# Patient Record
Sex: Female | Born: 1950 | Race: White | Hispanic: No | Smoking: Never smoker
Health system: Southern US, Community
[De-identification: ages and names within clinical notes are randomized; demographics above are authoritative.]

## PROBLEM LIST (undated history)

## (undated) DIAGNOSIS — J302 Other seasonal allergic rhinitis: Secondary | ICD-10-CM

## (undated) DIAGNOSIS — I10 Essential (primary) hypertension: Secondary | ICD-10-CM

## (undated) DIAGNOSIS — T7840XA Allergy, unspecified, initial encounter: Secondary | ICD-10-CM

## (undated) DIAGNOSIS — I499 Cardiac arrhythmia, unspecified: Secondary | ICD-10-CM

## (undated) DIAGNOSIS — K219 Gastro-esophageal reflux disease without esophagitis: Secondary | ICD-10-CM

## (undated) DIAGNOSIS — K760 Fatty (change of) liver, not elsewhere classified: Secondary | ICD-10-CM

## (undated) DIAGNOSIS — Z87442 Personal history of urinary calculi: Secondary | ICD-10-CM

## (undated) DIAGNOSIS — C84A0 Cutaneous T-cell lymphoma, unspecified, unspecified site: Principal | ICD-10-CM

## (undated) DIAGNOSIS — H269 Unspecified cataract: Secondary | ICD-10-CM

## (undated) DIAGNOSIS — I7300 Raynaud's syndrome without gangrene: Secondary | ICD-10-CM

## (undated) DIAGNOSIS — M1990 Unspecified osteoarthritis, unspecified site: Secondary | ICD-10-CM

## (undated) HISTORY — DX: Cutaneous T-cell lymphoma, unspecified, unspecified site: C84.A0

## (undated) HISTORY — PX: WISDOM TOOTH EXTRACTION: SHX21

## (undated) HISTORY — DX: Unspecified osteoarthritis, unspecified site: M19.90

## (undated) HISTORY — DX: Allergy, unspecified, initial encounter: T78.40XA

## (undated) HISTORY — DX: Unspecified cataract: H26.9

---

## 2007-04-24 ENCOUNTER — Other Ambulatory Visit: Payer: Self-pay

## 2007-04-24 ENCOUNTER — Emergency Department: Payer: Self-pay | Admitting: Emergency Medicine

## 2007-09-02 ENCOUNTER — Ambulatory Visit: Payer: Self-pay

## 2010-02-13 ENCOUNTER — Ambulatory Visit: Payer: Self-pay

## 2012-12-14 ENCOUNTER — Ambulatory Visit: Payer: Self-pay | Admitting: Oncology

## 2012-12-29 ENCOUNTER — Ambulatory Visit: Payer: Self-pay | Admitting: Oncology

## 2012-12-30 LAB — COMPREHENSIVE METABOLIC PANEL
Alkaline Phosphatase: 106 U/L (ref 50–136)
Anion Gap: 7 (ref 7–16)
BUN: 17 mg/dL (ref 7–18)
Calcium, Total: 8.6 mg/dL (ref 8.5–10.1)
Chloride: 106 mmol/L (ref 98–107)
Co2: 26 mmol/L (ref 21–32)
Potassium: 3.6 mmol/L (ref 3.5–5.1)
SGOT(AST): 22 U/L (ref 15–37)

## 2012-12-30 LAB — CBC CANCER CENTER
Eosinophil #: 0.1 x10 3/mm (ref 0.0–0.7)
Eosinophil %: 1.9 %
HCT: 38.9 % (ref 35.0–47.0)
HGB: 13 g/dL (ref 12.0–16.0)
Lymphocyte #: 2.2 x10 3/mm (ref 1.0–3.6)
Lymphocyte %: 29.5 %
MCH: 29.2 pg (ref 26.0–34.0)
MCHC: 33.5 g/dL (ref 32.0–36.0)
MCV: 87 fL (ref 80–100)
Monocyte #: 0.4 x10 3/mm (ref 0.2–0.9)
Neutrophil %: 61.7 %
RBC: 4.46 10*6/uL (ref 3.80–5.20)

## 2012-12-30 LAB — LACTATE DEHYDROGENASE: LDH: 179 U/L (ref 81–246)

## 2013-01-04 ENCOUNTER — Ambulatory Visit: Payer: Self-pay | Admitting: Oncology

## 2013-01-19 ENCOUNTER — Ambulatory Visit: Payer: Self-pay | Admitting: Oncology

## 2013-02-23 ENCOUNTER — Ambulatory Visit: Payer: Self-pay | Admitting: Oncology

## 2013-02-24 LAB — COMPREHENSIVE METABOLIC PANEL WITH GFR
Albumin: 3.3 g/dL — ABNORMAL LOW
Alkaline Phosphatase: 108 U/L
Anion Gap: 8
BUN: 14 mg/dL
Bilirubin,Total: 0.3 mg/dL
Calcium, Total: 8.8 mg/dL
Chloride: 103 mmol/L
Co2: 30 mmol/L
Creatinine: 0.68 mg/dL
EGFR (African American): >60
EGFR (Non-African Amer.): >60
Glucose: 121 mg/dL — ABNORMAL HIGH
Osmolality: 283
Potassium: 3.6 mmol/L
SGOT(AST): 29 U/L
SGPT (ALT): 45 U/L
Sodium: 141 mmol/L
Total Protein: 6.7 g/dL

## 2013-02-24 LAB — CBC CANCER CENTER
Basophil #: 0 "x10 3/mm "
Basophil %: 1 %
Eosinophil #: 0.1 "x10 3/mm "
Eosinophil %: 2.5 %
HCT: 37.7 %
HGB: 13.1 g/dL
Lymphocyte %: 50.6 %
Lymphs Abs: 2 "x10 3/mm "
MCH: 29.9 pg
MCHC: 34.8 g/dL
MCV: 86 fL
Monocyte #: 0.3 "x10 3/mm "
Monocyte %: 7.4 %
Neutrophil #: 1.5 "x10 3/mm "
Neutrophil %: 38.5 %
Platelet: 208 "x10 3/mm "
RBC: 4.38 "x10 6/mm "
RDW: 13.1 %
WBC: 3.9 "x10 3/mm "

## 2013-03-21 ENCOUNTER — Ambulatory Visit: Payer: Self-pay | Admitting: Oncology

## 2013-08-11 ENCOUNTER — Ambulatory Visit: Payer: Self-pay

## 2013-08-25 ENCOUNTER — Ambulatory Visit: Payer: Self-pay | Admitting: Oncology

## 2013-08-25 LAB — COMPREHENSIVE METABOLIC PANEL
Anion Gap: 2 — ABNORMAL LOW (ref 7–16)
BUN: 21 mg/dL — ABNORMAL HIGH (ref 7–18)
Bilirubin,Total: 0.5 mg/dL (ref 0.2–1.0)
Calcium, Total: 9.5 mg/dL (ref 8.5–10.1)
Chloride: 106 mmol/L (ref 98–107)
Co2: 30 mmol/L (ref 21–32)
EGFR (African American): >60
Glucose: 80 mg/dL (ref 65–99)
Potassium: 3.7 mmol/L (ref 3.5–5.1)
SGOT(AST): 32 U/L (ref 15–37)
SGPT (ALT): 39 U/L (ref 12–78)
Total Protein: 7.1 g/dL (ref 6.4–8.2)

## 2013-08-25 LAB — CBC CANCER CENTER
Basophil #: 0.1 x10 3/mm (ref 0.0–0.1)
Basophil %: 0.7 %
Eosinophil #: 0.2 x10 3/mm (ref 0.0–0.7)
Eosinophil %: 1.9 %
HGB: 13.7 g/dL (ref 12.0–16.0)
Lymphocyte #: 2.3 x10 3/mm (ref 1.0–3.6)
MCHC: 34 g/dL (ref 32.0–36.0)
Monocyte #: 0.6 x10 3/mm (ref 0.2–0.9)
Monocyte %: 6.8 %
Neutrophil #: 5.5 x10 3/mm (ref 1.4–6.5)
Neutrophil %: 64 %
Platelet: 279 x10 3/mm (ref 150–440)
RDW: 13.1 % (ref 11.5–14.5)
WBC: 8.6 x10 3/mm (ref 3.6–11.0)

## 2013-09-20 ENCOUNTER — Ambulatory Visit: Payer: Self-pay | Admitting: Oncology

## 2014-02-22 ENCOUNTER — Ambulatory Visit: Payer: Self-pay | Admitting: Oncology

## 2014-02-22 LAB — COMPREHENSIVE METABOLIC PANEL
ALK PHOS: 95 U/L
ALT: 26 U/L (ref 12–78)
AST: 22 U/L (ref 15–37)
Albumin: 3.7 g/dL (ref 3.4–5.0)
Anion Gap: 7 (ref 7–16)
BUN: 27 mg/dL — AB (ref 7–18)
Bilirubin,Total: 0.4 mg/dL (ref 0.2–1.0)
CALCIUM: 8.5 mg/dL (ref 8.5–10.1)
CO2: 29 mmol/L (ref 21–32)
Chloride: 105 mmol/L (ref 98–107)
Creatinine: 0.66 mg/dL (ref 0.60–1.30)
Glucose: 98 mg/dL (ref 65–99)
Osmolality: 286 (ref 275–301)
Potassium: 3.9 mmol/L (ref 3.5–5.1)
Sodium: 141 mmol/L (ref 136–145)
TOTAL PROTEIN: 6.4 g/dL (ref 6.4–8.2)

## 2014-02-22 LAB — CBC CANCER CENTER
BASOS ABS: 0.1 x10 3/mm (ref 0.0–0.1)
BASOS PCT: 1.2 %
EOS ABS: 0.4 x10 3/mm (ref 0.0–0.7)
EOS PCT: 4.3 %
HCT: 37.5 % (ref 35.0–47.0)
HGB: 12.6 g/dL (ref 12.0–16.0)
Lymphocyte #: 2.3 x10 3/mm (ref 1.0–3.6)
Lymphocyte %: 27.7 %
MCH: 28.9 pg (ref 26.0–34.0)
MCHC: 33.7 g/dL (ref 32.0–36.0)
MCV: 86 fL (ref 80–100)
MONOS PCT: 7.7 %
Monocyte #: 0.6 x10 3/mm (ref 0.2–0.9)
NEUTROS ABS: 4.8 x10 3/mm (ref 1.4–6.5)
Neutrophil %: 59.1 %
PLATELETS: 267 x10 3/mm (ref 150–440)
RBC: 4.36 10*6/uL (ref 3.80–5.20)
RDW: 13.2 % (ref 11.5–14.5)
WBC: 8.2 x10 3/mm (ref 3.6–11.0)

## 2014-02-22 LAB — LACTATE DEHYDROGENASE: LDH: 167 U/L (ref 81–246)

## 2014-02-22 LAB — SEDIMENTATION RATE: Erythrocyte Sed Rate: 9 mm/hr (ref 0–30)

## 2014-03-21 ENCOUNTER — Ambulatory Visit: Payer: Self-pay | Admitting: Oncology

## 2014-08-29 ENCOUNTER — Ambulatory Visit: Payer: Self-pay | Admitting: Oncology

## 2014-08-29 LAB — CBC CANCER CENTER
BASOS PCT: 1.1 %
Basophil #: 0.1 x10 3/mm (ref 0.0–0.1)
EOS ABS: 0.3 x10 3/mm (ref 0.0–0.7)
EOS PCT: 3.4 %
HCT: 38.9 % (ref 35.0–47.0)
HGB: 13 g/dL (ref 12.0–16.0)
Lymphocyte #: 2.4 x10 3/mm (ref 1.0–3.6)
Lymphocyte %: 30.8 %
MCH: 29.6 pg (ref 26.0–34.0)
MCHC: 33.4 g/dL (ref 32.0–36.0)
MCV: 89 fL (ref 80–100)
MONO ABS: 0.5 x10 3/mm (ref 0.2–0.9)
Monocyte %: 6.3 %
Neutrophil #: 4.5 x10 3/mm (ref 1.4–6.5)
Neutrophil %: 58.4 %
PLATELETS: 289 x10 3/mm (ref 150–440)
RBC: 4.39 10*6/uL (ref 3.80–5.20)
RDW: 13.6 % (ref 11.5–14.5)
WBC: 7.6 x10 3/mm (ref 3.6–11.0)

## 2014-08-29 LAB — COMPREHENSIVE METABOLIC PANEL
Albumin: 3.8 g/dL (ref 3.4–5.0)
Alkaline Phosphatase: 107 U/L
Anion Gap: 6 — ABNORMAL LOW (ref 7–16)
BUN: 18 mg/dL (ref 7–18)
Bilirubin,Total: 0.4 mg/dL (ref 0.2–1.0)
CALCIUM: 9.5 mg/dL (ref 8.5–10.1)
Chloride: 106 mmol/L (ref 98–107)
Co2: 31 mmol/L (ref 21–32)
Creatinine: 0.66 mg/dL (ref 0.60–1.30)
EGFR (African American): >60
EGFR (Non-African Amer.): >60
Glucose: 92 mg/dL (ref 65–99)
OSMOLALITY: 287 (ref 275–301)
Potassium: 3.5 mmol/L (ref 3.5–5.1)
SGOT(AST): 12 U/L — ABNORMAL LOW (ref 15–37)
SGPT (ALT): 24 U/L
SODIUM: 143 mmol/L (ref 136–145)
Total Protein: 6.7 g/dL (ref 6.4–8.2)

## 2014-08-29 LAB — CREATININE, SERUM: Creatine, Serum: 0.66

## 2014-09-20 ENCOUNTER — Ambulatory Visit: Payer: Self-pay | Admitting: Oncology

## 2015-03-24 ENCOUNTER — Other Ambulatory Visit: Payer: Self-pay | Admitting: *Deleted

## 2015-03-24 DIAGNOSIS — C84A0 Cutaneous T-cell lymphoma, unspecified, unspecified site: Secondary | ICD-10-CM

## 2015-03-27 ENCOUNTER — Other Ambulatory Visit: Payer: Self-pay

## 2015-03-27 ENCOUNTER — Inpatient Hospital Stay: Payer: BLUE CROSS/BLUE SHIELD | Attending: Oncology

## 2015-03-27 ENCOUNTER — Encounter: Payer: Self-pay | Admitting: Oncology

## 2015-03-27 ENCOUNTER — Encounter (INDEPENDENT_AMBULATORY_CARE_PROVIDER_SITE_OTHER): Payer: Self-pay

## 2015-03-27 ENCOUNTER — Ambulatory Visit: Payer: Self-pay | Admitting: Oncology

## 2015-03-27 ENCOUNTER — Inpatient Hospital Stay (HOSPITAL_BASED_OUTPATIENT_CLINIC_OR_DEPARTMENT_OTHER): Payer: BLUE CROSS/BLUE SHIELD | Admitting: Oncology

## 2015-03-27 VITALS — BP 147/95 | HR 86 | Temp 96.7°F | Wt 148.1 lb

## 2015-03-27 DIAGNOSIS — Z79899 Other long term (current) drug therapy: Secondary | ICD-10-CM | POA: Diagnosis not present

## 2015-03-27 DIAGNOSIS — C84A0 Cutaneous T-cell lymphoma, unspecified, unspecified site: Secondary | ICD-10-CM | POA: Insufficient documentation

## 2015-03-27 DIAGNOSIS — R21 Rash and other nonspecific skin eruption: Secondary | ICD-10-CM | POA: Insufficient documentation

## 2015-03-27 LAB — CBC WITH DIFFERENTIAL/PLATELET
Basophils Absolute: 0.1 10*3/uL (ref 0–0.1)
Basophils Relative: 1 %
Eosinophils Absolute: 0.1 10*3/uL (ref 0–0.7)
Eosinophils Relative: 2 %
HCT: 39.9 % (ref 35.0–47.0)
Hemoglobin: 13.5 g/dL (ref 12.0–16.0)
Lymphocytes Relative: 41 %
Lymphs Abs: 2.5 10*3/uL (ref 1.0–3.6)
MCH: 29.5 pg (ref 26.0–34.0)
MCHC: 33.9 g/dL (ref 32.0–36.0)
MCV: 86.9 fL (ref 80.0–100.0)
Monocytes Absolute: 0.5 10*3/uL (ref 0.2–0.9)
Monocytes Relative: 8 %
Neutro Abs: 2.9 10*3/uL (ref 1.4–6.5)
Neutrophils Relative %: 48 %
Platelets: 241 10*3/uL (ref 150–440)
RBC: 4.59 MIL/uL (ref 3.80–5.20)
RDW: 13.5 % (ref 11.5–14.5)
WBC: 6.1 10*3/uL (ref 3.6–11.0)

## 2015-03-27 LAB — COMPREHENSIVE METABOLIC PANEL WITH GFR
ALT: 47 U/L (ref 14–54)
AST: 40 U/L (ref 15–41)
Albumin: 4 g/dL (ref 3.5–5.0)
Alkaline Phosphatase: 107 U/L (ref 38–126)
Anion gap: 6 (ref 5–15)
BUN: 15 mg/dL (ref 6–20)
CO2: 27 mmol/L (ref 22–32)
Calcium: 9.3 mg/dL (ref 8.9–10.3)
Chloride: 107 mmol/L (ref 101–111)
Creatinine, Ser: 0.57 mg/dL (ref 0.44–1.00)
GFR calc Af Amer: >60 mL/min
GFR calc non Af Amer: >60 mL/min
Glucose, Bld: 88 mg/dL (ref 65–99)
Potassium: 3.7 mmol/L (ref 3.5–5.1)
Sodium: 140 mmol/L (ref 135–145)
Total Bilirubin: 0.6 mg/dL (ref 0.3–1.2)
Total Protein: 6.8 g/dL (ref 6.5–8.1)

## 2015-03-27 LAB — LACTATE DEHYDROGENASE: LDH: 164 U/L (ref 98–192)

## 2015-03-27 NOTE — Progress Notes (Signed)
Pt former smoker.  Does not have living will.  Pt c/o stomach pain that started in November.  Pt states if she sits up straight she does not have problems.  She can hear gurgling when she moves into straight system.  She has tried colon cleanse and probiotics. Thinks she has partial blockage.

## 2015-03-28 ENCOUNTER — Encounter: Payer: Self-pay | Admitting: Oncology

## 2015-03-28 DIAGNOSIS — C84A0 Cutaneous T-cell lymphoma, unspecified, unspecified site: Secondary | ICD-10-CM

## 2015-03-28 DIAGNOSIS — C8442 Peripheral T-cell lymphoma, not classified, intrathoracic lymph nodes: Secondary | ICD-10-CM | POA: Insufficient documentation

## 2015-03-28 HISTORY — DX: Cutaneous T-cell lymphoma, unspecified, unspecified site: C84.A0

## 2015-03-28 NOTE — Progress Notes (Signed)
Jeanne Lewis Telephone:(336) [phone]  Fax:(336) [place]: 12-27-1950  MR#: [id number]  UTM#:[id number]  Patient Care Team: No Pcp Per Patient as PCP - General (General Practice)  CHIEF COMPLAINT:  Chief Complaint  Patient presents with  . 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  Patient had significant side effect to that treatment.  Then  was put on triamcinolone 0.1% ointment from October 22 30 February 2 007 with a good response. patient did not have any significant followup since then.  Patient continued to have a rash and some .nodule but refused biopsy. 2.biopsy in March of 2001inguinal positive for CD34 from the left side abdomen revealed atypical mononuclear cells infiltrate. Cells were immuno positive for CD3 findings are not completely diagnostic may be consistent with CTCL     Cutaneous T-cell lymphoma   03/28/2015 Initial Diagnosis Cutaneous T-cell lymphoma    No flowsheet data found.  INTERVAL HISTORY: she was referred to me for further evaluation.  Complains owelling of both lower extremity.  Fatigue.  No chills.  No fever.  No cough or shortness of breath patient had a PET scan done a complete evaluaton  and restaging for cutaneous T-cell lymphoma   patient  is here for ongoing evaluation and treatment consideration. skin nodule has decreased in size.  Patient has not noticed any new areas remains afebrile.   patient has noticed   two small nodule on the left upper extremity Getting regular mammogram done under  [facility].  No chills.  No fever.Patient is here for further followup regarding atypical lymphocyte infiltrate the skin. March 27, 2015 Patient came today has noticed a few tiny nodule on both upper extremity.  Complains of some nonspecified epigastric discomfort.  Taking probiotic.  No  diarrhea.  No chills.  No fever.Marland Kitchen  REVIEW OF SYSTEMS:   Gen. status: No chills or fever. Skin: Has noticed few nodules in upper extremity GI: Epigastric discomfort.  No diarrhea.  No nausea or vomiting.  Taking probiotic.  Does not want to try Prilosec. Cardiac: No chest pain or paroxysmal nocturnal dyspnea Lungs: No cough or shortness of breath Lower extremity no edema Neurological system no headache or dizziness As per HPI. Otherwise, a complete review of systems is negatve.  PAST MEDICAL HISTORY: Past Medical History  Diagnosis Date  . Bone cancer   . Cutaneous T-cell lymphoma 03/28/2015    PAST SURGICAL HISTORY: PFSH:  Comments Family history of breast cancer and skin cancer.  Family history of heart disease and hypertension   Social History negative alcohol, negative tobacco   Additional Past Medical and Surgical History No significant of possible medical and surgical history    FAMILY HISTORY There is no significant family history of breast cancer, ovarian cancer, colon cancer  ADVANCED DIRECTIVES:  No advanced care directive information would be given HEALTH MAINTENANCE: History  Substance Use Topics  . Smoking status: Never Smoker   . Smokeless tobacco: Not on file  . Alcohol Use: Not on file      Allergies  Allergen Reactions  . Morphine And Related Other (See Comments)    Blurred vision  . Neosporin [Neomycin-Bacitracin Zn-Polymyx] Itching  . Polysporin [Bacitracin-Polymyxin B] Itching    Current Outpatient Prescriptions  Medication Sig Dispense Refill  . acetaminophen (TYLENOL) 325 MG tablet Take  650 mg by mouth every 4 (four) hours as needed.    . Ascorbic Acid (VITAMIN C) 1000 MG tablet Take 1,000 mg by mouth daily.    Marland Kitchen aspirin 81 MG tablet Take 81 mg by mouth daily.    . Cholecalciferol (VITAMIN D-3) 1000 UNITS CAPS Take 1 capsule by mouth daily at 6 (six) AM.    . loratadine (CLARITIN) 10 MG tablet Take 10 mg by mouth daily.    . naproxen  (NAPROSYN) 500 MG tablet Take 500 mg by mouth 2 (two) times daily with a meal.     No current facility-administered medications for this visit.    OBJECTIVE:  Filed Vitals:   03/27/15 1410  BP: 147/95  Pulse: 86  Temp: 96.7 F (35.9 C)     Body mass index is 29.09 kg/(m^2).    ECOG FS:0 - Asymptomatic  PHYSICAL EXAM: Goal status: Performance status is good.  Patient has not lost significant weight HEENT: No evidence of stomatitis. Sclera and conjunctivae :: No jaundice.   pale looking. Lungs: Air  entry equal on both sides.  No rhonchi.  No rales.  Cardiac: Heart sounds are normal.  No pericardial rub.  No murmur. Lymphatic system: Cervical, axillary, inguinal, lymph nodes not palpable GI: Abdomen is soft.  No ascites.  Liver spleen not palpable.  No tenderness.  Bowel sounds are within normal limit Lower extremity: No edema Neurological system: Higher functions, cranial nerves intact no evidence of peripheral neuropathy. Skin: Few nodules in left upper extremity and right upper extremity very small few millimeter No other skin abnormality or lymphatic abnormality noticed   LAB RESULTS:  Appointment on 03/27/2015  Component Date Value Ref Range Status  . WBC 03/27/2015 6.1  3.6 - 11.0 K/uL Final  . RBC 03/27/2015 4.59  3.80 - 5.20 MIL/uL Final  . Hemoglobin 03/27/2015 13.5  12.0 - 16.0 g/dL Final  . HCT 03/27/2015 39.9  35.0 - 47.0 % Final  . MCV 03/27/2015 86.9  80.0 - 100.0 fL Final  . MCH 03/27/2015 29.5  26.0 - 34.0 pg Final  . MCHC 03/27/2015 33.9  32.0 - 36.0 g/dL Final  . RDW 03/27/2015 13.5  11.5 - 14.5 % Final  . Platelets 03/27/2015 241  150 - 440 K/uL Final  . Neutrophils Relative % 03/27/2015 48   Final  . Neutro Abs 03/27/2015 2.9  1.4 - 6.5 K/uL Final  . Lymphocytes Relative 03/27/2015 41   Final  . Lymphs Abs 03/27/2015 2.5  1.0 - 3.6 K/uL Final  . Monocytes Relative 03/27/2015 8   Final  . Monocytes Absolute 03/27/2015 0.5  0.2 - 0.9 K/uL Final  .  Eosinophils Relative 03/27/2015 2   Final  . Eosinophils Absolute 03/27/2015 0.1  0 - 0.7 K/uL Final  . Basophils Relative 03/27/2015 1   Final  . Basophils Absolute 03/27/2015 0.1  0 - 0.1 K/uL Final  . Sodium 03/27/2015 140  135 - 145 mmol/L Final  . Potassium 03/27/2015 3.7  3.5 - 5.1 mmol/L Final  . Chloride 03/27/2015 107  101 - 111 mmol/L Final  . CO2 03/27/2015 27  22 - 32 mmol/L Final  . Glucose, Bld 03/27/2015 88  65 - 99 mg/dL Final  . BUN 03/27/2015 15  6 - 20 mg/dL Final  . Creatinine, Ser 03/27/2015 0.57  0.44 - 1.00 mg/dL Final  . Calcium 03/27/2015 9.3  8.9 - 10.3 mg/dL Final  . Total Protein 03/27/2015 6.8  6.5 - 8.1 g/dL Final  .  Albumin 03/27/2015 4.0  3.5 - 5.0 g/dL Final  . AST 03/27/2015 40  15 - 41 U/L Final  . ALT 03/27/2015 47  14 - 54 U/L Final  . Alkaline Phosphatase 03/27/2015 107  38 - 126 U/L Final  . Total Bilirubin 03/27/2015 0.6  0.3 - 1.2 mg/dL Final  . GFR calc non Af Amer 03/27/2015 >60  >60 mL/min Final  . GFR calc Af Amer 03/27/2015 >60  >60 mL/min Final   Comment: (NOTE) The eGFR has been calculated using the CKD EPI equation. This calculation has not been validated in all clinical situations. eGFR's persistently <60 mL/min signify possible Chronic Kidney Disease.   . Anion gap 03/27/2015 6  5 - 15 Final  . LDH 03/27/2015 164  98 - 192 U/L Final      STUDIES: No results found.  ASSESSMENT: [age]-year-old lady with suspected cutaneous T-cell lymphoma presently not on any therapy. Patient never had definitive diagnosis of cutaneous T-cell lymphoma   MEDICAL DECISION MAKING:  All lab data has been reviewed. Nonspecific skin nodule needs to be biopsied patient was referred to dermatologist and will be followed after biopsies done Abdominal pain is nonspecific patient was initially advised to take Prilosec but patient refused the pain persist and if skin nodule shows that diagnosis of cutaneous T-cell lymphoma PET scan or CT scan would be  advised Reevaluate patient's skin biopsy  Patient expressed understanding and was in agreement with this plan. She also understands that She can call clinic at any time with any questions, concerns, or complaints.    No matching staging information was found for the patient.  Forest Gleason, MD   03/28/2015 8:18 AM

## 2015-04-11 ENCOUNTER — Telehealth: Payer: Self-pay | Admitting: *Deleted

## 2015-04-11 DIAGNOSIS — C84A0 Cutaneous T-cell lymphoma, unspecified, unspecified site: Secondary | ICD-10-CM

## 2015-04-11 NOTE — Telephone Encounter (Signed)
OK to cancel 6/30 appt, reschedule her to see Dr Oliva Bustard in 1 year with CBC METC, LDH per Dr Oliva Bustard. Message sent to scheduling and patient notified of this

## 2015-04-18 ENCOUNTER — Ambulatory Visit: Payer: Self-pay | Admitting: Oncology

## 2015-04-18 ENCOUNTER — Other Ambulatory Visit: Payer: Self-pay

## 2015-04-20 ENCOUNTER — Ambulatory Visit: Payer: BLUE CROSS/BLUE SHIELD | Admitting: Oncology

## 2015-10-25 ENCOUNTER — Other Ambulatory Visit: Payer: Self-pay | Admitting: Oncology

## 2015-10-25 ENCOUNTER — Other Ambulatory Visit: Payer: Self-pay | Admitting: Family Medicine

## 2015-10-25 DIAGNOSIS — Z1231 Encounter for screening mammogram for malignant neoplasm of breast: Secondary | ICD-10-CM

## 2015-11-02 ENCOUNTER — Ambulatory Visit: Payer: BLUE CROSS/BLUE SHIELD | Attending: Family Medicine

## 2015-11-30 ENCOUNTER — Ambulatory Visit
Admission: RE | Admit: 2015-11-30 | Discharge: 2015-11-30 | Disposition: A | Discharge status: Home / Self Care | Payer: BLUE CROSS/BLUE SHIELD | Source: Ambulatory Visit | Attending: Family Medicine | Admitting: Family Medicine

## 2015-11-30 DIAGNOSIS — Z1231 Encounter for screening mammogram for malignant neoplasm of breast: Secondary | ICD-10-CM

## 2015-12-04 ENCOUNTER — Other Ambulatory Visit: Payer: Self-pay | Admitting: Family Medicine

## 2015-12-04 DIAGNOSIS — Z1231 Encounter for screening mammogram for malignant neoplasm of breast: Secondary | ICD-10-CM

## 2016-04-10 ENCOUNTER — Other Ambulatory Visit: Payer: BLUE CROSS/BLUE SHIELD

## 2016-04-10 ENCOUNTER — Ambulatory Visit: Payer: BLUE CROSS/BLUE SHIELD | Admitting: Oncology

## 2016-04-10 ENCOUNTER — Inpatient Hospital Stay: Payer: BLUE CROSS/BLUE SHIELD | Attending: Oncology | Admitting: Oncology

## 2016-04-10 ENCOUNTER — Inpatient Hospital Stay: Payer: BLUE CROSS/BLUE SHIELD

## 2016-04-10 VITALS — BP 124/80 | HR 105 | Temp 98.0°F

## 2016-04-10 DIAGNOSIS — Z79899 Other long term (current) drug therapy: Secondary | ICD-10-CM | POA: Diagnosis not present

## 2016-04-10 DIAGNOSIS — R2232 Localized swelling, mass and lump, left upper limb: Secondary | ICD-10-CM | POA: Insufficient documentation

## 2016-04-10 DIAGNOSIS — C84A0 Cutaneous T-cell lymphoma, unspecified, unspecified site: Secondary | ICD-10-CM

## 2016-04-10 DIAGNOSIS — Z8572 Personal history of non-Hodgkin lymphomas: Secondary | ICD-10-CM | POA: Insufficient documentation

## 2016-04-10 DIAGNOSIS — Z780 Asymptomatic menopausal state: Secondary | ICD-10-CM | POA: Diagnosis not present

## 2016-04-10 LAB — CBC WITH DIFFERENTIAL/PLATELET
Basophils Absolute: 0.1 10*3/uL (ref 0–0.1)
Basophils Relative: 1 %
EOS ABS: 0.2 10*3/uL (ref 0–0.7)
Eosinophils Relative: 3 %
HCT: 39.1 % (ref 35.0–47.0)
HEMOGLOBIN: 13.4 g/dL (ref 12.0–16.0)
LYMPHS ABS: 2.3 10*3/uL (ref 1.0–3.6)
LYMPHS PCT: 35 %
MCH: 30.4 pg (ref 26.0–34.0)
MCHC: 34.3 g/dL (ref 32.0–36.0)
MCV: 88.5 fL (ref 80.0–100.0)
MONOS PCT: 6 %
Monocytes Absolute: 0.4 10*3/uL (ref 0.2–0.9)
NEUTROS PCT: 55 %
Neutro Abs: 3.7 10*3/uL (ref 1.4–6.5)
Platelets: 239 10*3/uL (ref 150–440)
RBC: 4.42 MIL/uL (ref 3.80–5.20)
RDW: 13.1 % (ref 11.5–14.5)
WBC: 6.7 10*3/uL (ref 3.6–11.0)

## 2016-04-10 LAB — COMPREHENSIVE METABOLIC PANEL
ALK PHOS: 86 U/L (ref 38–126)
ALT: 23 U/L (ref 14–54)
ANION GAP: 8 (ref 5–15)
AST: 25 U/L (ref 15–41)
Albumin: 4.2 g/dL (ref 3.5–5.0)
BILIRUBIN TOTAL: 0.4 mg/dL (ref 0.3–1.2)
BUN: 23 mg/dL — ABNORMAL HIGH (ref 6–20)
CALCIUM: 9.1 mg/dL (ref 8.9–10.3)
CO2: 26 mmol/L (ref 22–32)
CREATININE: 0.67 mg/dL (ref 0.44–1.00)
Chloride: 105 mmol/L (ref 101–111)
Glucose, Bld: 121 mg/dL — ABNORMAL HIGH (ref 65–99)
Potassium: 3.6 mmol/L (ref 3.5–5.1)
SODIUM: 139 mmol/L (ref 135–145)
Total Protein: 6.8 g/dL (ref 6.5–8.1)

## 2016-04-10 LAB — LACTATE DEHYDROGENASE: LDH: 148 U/L (ref 98–192)

## 2016-04-10 NOTE — Progress Notes (Signed)
[place]  Telephone:(336) [phone]  Fax:(336) [place] DOB: December 18, 1950  MR#: [id number]  HKV#:[id number]  Patient Care Team: No Pcp Per Patient as PCP - General (General Practice)  CHIEF COMPLAINT:  Chief Complaint  Patient presents with  . Follow-up    INTERVAL HISTORY: Patient returns to clinic for one year follow up. She was diagnosed with T-cell lymphoma in 2002 and finished treatment at [facility] in 2003.  She currently feels well. She does have a nodule in the left upper extremity. She has no other complaints today.    REVIEW OF SYSTEMS:   Review of Systems  Constitutional: Negative.   HENT: Negative.   Eyes: Negative.   Respiratory: Negative.   Cardiovascular: Negative.   Gastrointestinal: Negative.   Genitourinary: Negative.   Musculoskeletal: Negative.   Skin: Positive for rash. Negative for itching.  Neurological: Negative.   Endo/Heme/Allergies: Negative.   Psychiatric/Behavioral: Negative.     As per HPI. Otherwise, a complete review of systems is negatve.  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  Patient had significant side effect to that treatment.  Then  was put on triamcinolone 0.1% ointment from October 22 30 February 2 007 with a good response. patient did not have any significant followup since then.  Patient continued to have a rash and some .nodule but refused biopsy. 2.biopsy in March of 2001inguinal positive for CD34 from the left side abdomen revealed atypical mononuclear cells infiltrate. Cells were immuno positive for CD3 findings are not completely diagnostic may be consistent with CTCL     Cutaneous T-cell lymphoma ([place])   03/28/2015 Initial Diagnosis Cutaneous T-cell lymphoma    PAST MEDICAL HISTORY: Past Medical History  Diagnosis Date  . Cutaneous T-cell  lymphoma ([place]) 03/28/2015    PAST SURGICAL HISTORY: No past surgical history on file.  FAMILY HISTORY Family History  Problem Relation Age of Onset  . Breast cancer Maternal Aunt 63    GYNECOLOGIC HISTORY:  No LMP recorded. Patient is postmenopausal.     ADVANCED DIRECTIVES:    HEALTH MAINTENANCE: Social History  Substance Use Topics  . Smoking status: Never Smoker   . Smokeless tobacco: Not on file  . Alcohol Use: Not on file    Allergies  Allergen Reactions  . Morphine And Related Other (See Comments)    Blurred vision  . Neosporin [Neomycin-Bacitracin Zn-Polymyx] Itching  . Polysporin [Bacitracin-Polymyxin B] Itching    Current Outpatient Prescriptions  Medication Sig Dispense Refill  . Ascorbic Acid (VITAMIN C) 1000 MG tablet Take 1,000 mg by mouth daily.    Marland Kitchen loratadine (CLARITIN) 10 MG tablet Take 10 mg by mouth daily.    Marland Kitchen acetaminophen (TYLENOL) 325 MG tablet Take 650 mg by mouth every 4 (four) hours as needed. Reported on 04/10/2016    . aspirin 81 MG tablet Take 81 mg by mouth daily. Reported on 04/10/2016    . Cholecalciferol (VITAMIN D-3) 1000 UNITS CAPS Take 1 capsule by mouth daily at 6 (six) AM. Reported on 04/10/2016    . naproxen (NAPROSYN) 500 MG tablet Take 500 mg by mouth 2 (two) times daily with a meal. Reported on 04/10/2016     No current facility-administered medications for this visit.    OBJECTIVE: BP 124/80 mmHg  Pulse 105  Temp(Src) 98 F (36.7 C) (Tympanic)  There is no weight on file to calculate BMI.    ECOG FS:0 - Asymptomatic  General: Well-developed, well-nourished, no acute distress. Eyes: Pink conjunctiva, anicteric sclera. HEENT: Normocephalic, moist mucous membranes, clear oropharnyx. Lungs: Clear to auscultation bilaterally. Heart: Regular rate and rhythm. No rubs, murmurs, or gallops. Abdomen: Soft, nontender, nondistended.  Musculoskeletal: No edema, cyanosis, or clubbing. Neuro: Alert, answering all questions  appropriately. Skin: Nodule left upper extremity. erythmic rash bilateral arms Psych: Normal affect.   LAB RESULTS:  Appointment on 04/10/2016  Component Date Value Ref Range Status  . WBC 04/10/2016 6.7  3.6 - 11.0 K/uL Final  . RBC 04/10/2016 4.42  3.80 - 5.20 MIL/uL Final  . Hemoglobin 04/10/2016 13.4  12.0 - 16.0 g/dL Final  . HCT 04/10/2016 39.1  35.0 - 47.0 % Final  . MCV 04/10/2016 88.5  80.0 - 100.0 fL Final  . MCH 04/10/2016 30.4  26.0 - 34.0 pg Final  . MCHC 04/10/2016 34.3  32.0 - 36.0 g/dL Final  . RDW 04/10/2016 13.1  11.5 - 14.5 % Final  . Platelets 04/10/2016 239  150 - 440 K/uL Final  . Neutrophils Relative % 04/10/2016 55   Final  . Neutro Abs 04/10/2016 3.7  1.4 - 6.5 K/uL Final  . Lymphocytes Relative 04/10/2016 35   Final  . Lymphs Abs 04/10/2016 2.3  1.0 - 3.6 K/uL Final  . Monocytes Relative 04/10/2016 6   Final  . Monocytes Absolute 04/10/2016 0.4  0.2 - 0.9 K/uL Final  . Eosinophils Relative 04/10/2016 3   Final  . Eosinophils Absolute 04/10/2016 0.2  0 - 0.7 K/uL Final  . Basophils Relative 04/10/2016 1   Final  . Basophils Absolute 04/10/2016 0.1  0 - 0.1 K/uL Final  . Sodium 04/10/2016 139  135 - 145 mmol/L Final  . Potassium 04/10/2016 3.6  3.5 - 5.1 mmol/L Final  . Chloride 04/10/2016 105  101 - 111 mmol/L Final  . CO2 04/10/2016 26  22 - 32 mmol/L Final  . Glucose, Bld 04/10/2016 121* 65 - 99 mg/dL Final  . BUN 04/10/2016 23* 6 - 20 mg/dL Final  . Creatinine, Ser 04/10/2016 0.67  0.44 - 1.00 mg/dL Final  . Calcium 04/10/2016 9.1  8.9 - 10.3 mg/dL Final  . Total Protein 04/10/2016 6.8  6.5 - 8.1 g/dL Final  . Albumin 04/10/2016 4.2  3.5 - 5.0 g/dL Final  . AST 04/10/2016 25  15 - 41 U/L Final  . ALT 04/10/2016 23  14 - 54 U/L Final  . Alkaline Phosphatase 04/10/2016 86  38 - 126 U/L Final  . Total Bilirubin 04/10/2016 0.4  0.3 - 1.2 mg/dL Final  . GFR calc non Af Amer 04/10/2016 >60  >60 mL/min Final  . GFR calc Af Amer 04/10/2016 >60  >60  mL/min Final   Comment: (NOTE) The eGFR has been calculated using the CKD EPI equation. This calculation has not been validated in all clinical situations. eGFR's persistently <60 mL/min signify possible Chronic Kidney Disease.   . Anion gap 04/10/2016 8  5 - 15 Final  . LDH 04/10/2016 148  98 - 192 U/L Final    STUDIES: No results found.  ASSESSMENT: History T-cell lymphoma   PLAN:    1. Skin nodule: Patient recommended to make appointment with her dermatologist. Follow up per dermatology.  2. T cell lymphoma: Follow up in one year.  Patient expressed understanding and was in agreement with this plan. She also understands that She can call  clinic at any time with any questions, concerns, or complaints.   Dr. Rogue Bussing was available for consultation and review of plan of care for this patient.   Mayra Reel, NP   04/10/2016 4:17 PM

## 2016-04-10 NOTE — Progress Notes (Signed)
Patient denies pain or discomfort.  Patient ambulates without assistance, brought to exam room 17.  Medication record updated, information provided by patient.  Dr. Ree Kida .

## 2017-03-10 ENCOUNTER — Other Ambulatory Visit: Payer: Self-pay | Admitting: Family Medicine

## 2017-03-10 DIAGNOSIS — Z1231 Encounter for screening mammogram for malignant neoplasm of breast: Secondary | ICD-10-CM

## 2017-04-10 ENCOUNTER — Other Ambulatory Visit: Payer: BLUE CROSS/BLUE SHIELD

## 2017-04-10 ENCOUNTER — Ambulatory Visit: Payer: BLUE CROSS/BLUE SHIELD | Admitting: Internal Medicine

## 2017-04-11 ENCOUNTER — Other Ambulatory Visit: Payer: Self-pay | Admitting: *Deleted

## 2017-04-11 DIAGNOSIS — C84A0 Cutaneous T-cell lymphoma, unspecified, unspecified site: Secondary | ICD-10-CM

## 2017-04-16 ENCOUNTER — Inpatient Hospital Stay: Payer: Medicare Other

## 2017-04-16 ENCOUNTER — Inpatient Hospital Stay: Payer: Medicare Other | Attending: Internal Medicine | Admitting: Internal Medicine

## 2017-04-16 VITALS — BP 135/90 | HR 92 | Temp 98.0°F | Wt 150.2 lb

## 2017-04-16 DIAGNOSIS — Z9229 Personal history of other drug therapy: Secondary | ICD-10-CM | POA: Diagnosis not present

## 2017-04-16 DIAGNOSIS — C84A0 Cutaneous T-cell lymphoma, unspecified, unspecified site: Secondary | ICD-10-CM

## 2017-04-16 DIAGNOSIS — Z7982 Long term (current) use of aspirin: Secondary | ICD-10-CM | POA: Diagnosis not present

## 2017-04-16 DIAGNOSIS — Z79899 Other long term (current) drug therapy: Secondary | ICD-10-CM | POA: Insufficient documentation

## 2017-04-16 DIAGNOSIS — Z8572 Personal history of non-Hodgkin lymphomas: Secondary | ICD-10-CM | POA: Insufficient documentation

## 2017-04-16 LAB — CBC WITH DIFFERENTIAL/PLATELET
Basophils Absolute: 0.1 10*3/uL (ref 0–0.1)
Basophils Relative: 1 %
EOS PCT: 4 %
Eosinophils Absolute: 0.3 10*3/uL (ref 0–0.7)
HEMATOCRIT: 39 % (ref 35.0–47.0)
Hemoglobin: 13.6 g/dL (ref 12.0–16.0)
LYMPHS ABS: 2.3 10*3/uL (ref 1.0–3.6)
LYMPHS PCT: 38 %
MCH: 30.2 pg (ref 26.0–34.0)
MCHC: 34.9 g/dL (ref 32.0–36.0)
MCV: 86.6 fL (ref 80.0–100.0)
Monocytes Absolute: 0.4 10*3/uL (ref 0.2–0.9)
Monocytes Relative: 6 %
NEUTROS ABS: 3 10*3/uL (ref 1.4–6.5)
Neutrophils Relative %: 51 %
PLATELETS: 285 10*3/uL (ref 150–440)
RBC: 4.5 MIL/uL (ref 3.80–5.20)
RDW: 13.1 % (ref 11.5–14.5)
WBC: 6.1 10*3/uL (ref 3.6–11.0)

## 2017-04-16 LAB — COMPREHENSIVE METABOLIC PANEL
ALT: 20 U/L (ref 14–54)
AST: 23 U/L (ref 15–41)
Albumin: 4.3 g/dL (ref 3.5–5.0)
Alkaline Phosphatase: 90 U/L (ref 38–126)
Anion gap: 8 (ref 5–15)
BILIRUBIN TOTAL: 0.6 mg/dL (ref 0.3–1.2)
BUN: 15 mg/dL (ref 6–20)
CALCIUM: 9.2 mg/dL (ref 8.9–10.3)
CHLORIDE: 103 mmol/L (ref 101–111)
CO2: 27 mmol/L (ref 22–32)
CREATININE: 0.62 mg/dL (ref 0.44–1.00)
Glucose, Bld: 98 mg/dL (ref 65–99)
Potassium: 4.3 mmol/L (ref 3.5–5.1)
Sodium: 138 mmol/L (ref 135–145)
TOTAL PROTEIN: 7 g/dL (ref 6.5–8.1)

## 2017-04-16 LAB — LACTATE DEHYDROGENASE: LDH: 136 U/L (ref 98–192)

## 2017-04-16 NOTE — Progress Notes (Signed)
[facility] OFFICE PROGRESS NOTE  Patient Care Team: Patient, No Pcp Per as PCP - General (General Practice)  Cancer Staging No matching staging information was found for the patient.   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  Patient had significant side effect to that treatment.  Then  was put on triamcinolone 0.1% ointment from October 22 30 February 2 007 with a good response. patient did not have any significant followup since then.  Patient continued to have a rash and some .nodule but refused biopsy.   2.[Dr.Choksi] biopsy in March of 2011inguinal positive for CD34 from the left side abdomen revealed atypical mononuclear cells infiltrate. 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 I reviewed the patient's prior charts/pertinent labs/imaging in detail; findings are summarized above.     INTERVAL HISTORY:  Jeanne Lewis 66 y.o.  female pleasant patient above history of Cutaneous peripheral  T cell lymphoma status post PUVA  Therapy is here for follow-up   Patient denies any new skin lesions or lumps. She continues to follow with dermatology.  REVIEW OF SYSTEMS:  A complete 10 point review of system is done which is negative except mentioned above/history of present illness.   PAST MEDICAL HISTORY :  Past Medical History:  Diagnosis Date  . Cutaneous T-cell lymphoma ([place]) 03/28/2015    PAST SURGICAL HISTORY :  No past surgical history on file.  FAMILY HISTORY :   Family History  Problem Relation Age of Onset  . Breast cancer Maternal Aunt 63    SOCIAL HISTORY:   Social History  Substance  Use Topics  . Smoking status: Never Smoker  . Smokeless tobacco: Not on file  . Alcohol use Not on file    ALLERGIES:  is allergic to morphine and related; neosporin [neomycin-bacitracin zn-polymyx]; and polysporin [bacitracin-polymyxin b].  MEDICATIONS:  Current Outpatient Prescriptions  Medication Sig Dispense Refill  . acetaminophen (TYLENOL) 325 MG tablet Take 650 mg by mouth every 4 (four) hours as needed. Reported on 04/10/2016    . Ascorbic Acid (VITAMIN C) 1000 MG tablet Take 1,000 mg by mouth daily.    Marland Kitchen aspirin 81 MG tablet Take 81 mg by mouth daily. Reported on 04/10/2016    . Cholecalciferol (VITAMIN D-3) 1000 UNITS CAPS Take 1 capsule by mouth daily at 6 (six) AM. Reported on 04/10/2016    . fluticasone (FLONASE) 50 MCG/ACT nasal spray     . loratadine (CLARITIN) 10 MG tablet Take 10 mg by mouth daily.     No current facility-administered medications for this visit.     PHYSICAL EXAMINATION: ECOG PERFORMANCE STATUS: 0 - Asymptomatic  BP 135/90 (BP Location: Left Arm, Patient Position: Sitting)   Pulse 92   Temp 98 F (36.7 C) (Tympanic)   Wt 150 lb 4 oz (68.2 kg)   BMI 29.50 kg/m   Filed Weights   04/16/17 1103  Weight: 150 lb 4 oz (68.2 kg)    GENERAL: Well-nourished well-developed; Alert, no distress and comfortable.   Alone.ES: no pallor or icterus OROPHARYNX: no thrush or ulceration; good dentition  NECK: supple, no masses  felt LYMPH:  no palpable lymphadenopathy in the cervical, axillary or inguinal regions LUNGS: clear to auscultation and  No wheeze or crackles HEART/CVS: regular rate & rhythm and no murmurs; No lower extremity edema ABDOMEN:abdomen soft, non-tender and normal bowel sounds Musculoskeletal:no cyanosis of digits and no clubbing  PSYCH: alert & oriented x 3 with fluent speech NEURO: no focal motor/sensory deficits SKIN:  no rashes or significant lesions  LABORATORY DATA:  I have reviewed the data as listed    Component Value Date/Time    NA 138 04/16/2017 1030   NA 143 08/29/2014 1508   K 4.3 04/16/2017 1030   K 3.5 08/29/2014 1508   CL 103 04/16/2017 1030   CL 106 08/29/2014 1508   CO2 27 04/16/2017 1030   CO2 31 08/29/2014 1508   GLUCOSE 98 04/16/2017 1030   GLUCOSE 92 08/29/2014 1508   BUN 15 04/16/2017 1030   BUN 18 08/29/2014 1508   CREATININE 0.62 04/16/2017 1030   CREATININE 0.66 08/29/2014 1508   CALCIUM 9.2 04/16/2017 1030   CALCIUM 9.5 08/29/2014 1508   PROT 7.0 04/16/2017 1030   PROT 6.7 08/29/2014 1508   ALBUMIN 4.3 04/16/2017 1030   ALBUMIN 3.8 08/29/2014 1508   AST 23 04/16/2017 1030   AST 12 (L) 08/29/2014 1508   ALT 20 04/16/2017 1030   ALT 24 08/29/2014 1508   ALKPHOS 90 04/16/2017 1030   ALKPHOS 107 08/29/2014 1508   BILITOT 0.6 04/16/2017 1030   BILITOT 0.4 08/29/2014 1508   GFRNONAA >60 04/16/2017 1030   GFRNONAA >60 08/29/2014 1508   GFRNONAA >60 02/22/2014 1450   GFRAA >60 04/16/2017 1030   GFRAA >60 08/29/2014 1508   GFRAA >60 02/22/2014 1450    No results found for: SPEP, UPEP  Lab Results  Component Value Date   WBC 6.1 04/16/2017   NEUTROABS 3.0 04/16/2017   HGB 13.6 04/16/2017   HCT 39.0 04/16/2017   MCV 86.6 04/16/2017   PLT 285 04/16/2017      Chemistry      Component Value Date/Time   NA 138 04/16/2017 1030   NA 143 08/29/2014 1508   K 4.3 04/16/2017 1030   K 3.5 08/29/2014 1508   CL 103 04/16/2017 1030   CL 106 08/29/2014 1508   CO2 27 04/16/2017 1030   CO2 31 08/29/2014 1508   BUN 15 04/16/2017 1030   BUN 18 08/29/2014 1508   CREATININE 0.62 04/16/2017 1030   CREATININE 0.66 08/29/2014 1508      Component Value Date/Time   CALCIUM 9.2 04/16/2017 1030   CALCIUM 9.5 08/29/2014 1508   ALKPHOS 90 04/16/2017 1030   ALKPHOS 107 08/29/2014 1508   AST 23 04/16/2017 1030   AST 12 (L) 08/29/2014 1508   ALT 20 04/16/2017 1030   ALT 24 08/29/2014 1508   BILITOT 0.6 04/16/2017 1030   BILITOT 0.4 08/29/2014 1508       RADIOGRAPHIC STUDIES: I have  personally reviewed the radiological images as listed and agreed with the findings in the report. No results found.   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 Labs within normal limits.  Recommend continued follow-up with dermatology.   # follow up in 12 months/labs-if patient stable- then potentially could be discharged from the clinic at that time.   Orders Placed This Encounter  Procedures  . CBC with Differential/Platelet    Standing Status:  Future    Standing Expiration Date:   10/16/2018  . Comprehensive metabolic panel    Standing Status:   Future    Standing Expiration Date:   10/16/2018  . Lactate dehydrogenase    Standing Status:   Future    Standing Expiration Date:   10/16/2018   All questions were answered. The patient knows to call the clinic with any problems, questions or concerns.      Cammie Sickle, MD 04/18/2017 6:54 PM

## 2017-04-16 NOTE — Progress Notes (Signed)
Patient here today for follow up.   

## 2017-04-16 NOTE — Assessment & Plan Note (Addendum)
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 Labs within normal limits.  Recommend continued follow-up with dermatology.   # follow up in 12 months/labs-if patient stable- then potentially could be discharged from the clinic at that time.

## 2017-05-09 ENCOUNTER — Ambulatory Visit
Admission: RE | Admit: 2017-05-09 | Discharge: 2017-05-09 | Disposition: A | Discharge status: Home / Self Care | Payer: Medicare Other | Source: Ambulatory Visit | Attending: Family Medicine | Admitting: Family Medicine

## 2017-05-09 DIAGNOSIS — Z1231 Encounter for screening mammogram for malignant neoplasm of breast: Secondary | ICD-10-CM | POA: Diagnosis present

## 2017-12-29 ENCOUNTER — Ambulatory Visit (INDEPENDENT_AMBULATORY_CARE_PROVIDER_SITE_OTHER): Payer: Medicare Other | Admitting: Vascular Surgery

## 2017-12-29 ENCOUNTER — Encounter (INDEPENDENT_AMBULATORY_CARE_PROVIDER_SITE_OTHER): Payer: Self-pay | Admitting: Vascular Surgery

## 2017-12-29 DIAGNOSIS — I7300 Raynaud's syndrome without gangrene: Secondary | ICD-10-CM

## 2017-12-29 DIAGNOSIS — M79606 Pain in leg, unspecified: Secondary | ICD-10-CM | POA: Diagnosis not present

## 2017-12-29 NOTE — Progress Notes (Signed)
MRN : [id number]  Jeanne Lewis is a 67 y.o. (Mar 04, 1951) female who presents with chief complaint of blue discoloration of the toes.  History of Present Illness: The patient is seen for the evaluation of Raynaud's changes of the toes. Exposure to cold environments makes the symptoms much worse. The changes have been going on for years and seemed to be much worse lately. There is no history of trauma or repetitive injury. The patient denies peeling of the skin of the fingers in the palms and the skin of the toes on the soles.  The patient has not been taking Norvasc  There is no history of malignancy.  The patient denies amaurosis fugax or recent TIA symptoms. There are no recent neurological changes noted. The patient denies claudication symptoms or rest pain symptoms. The patient denies history of DVT, PE or superficial thrombophlebitis. The patient denies recent episodes of angina or shortness of breath.    No outpatient medications have been marked as taking for the 12/29/17 encounter (Appointment) with Delana Meyer, Dolores Lory, MD.    Past Medical History:  Diagnosis Date  . 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  Social History Social History   Tobacco Use  . Smoking status: Never Smoker  Substance Use Topics  . Alcohol use: Not on file  . Drug use: Not on file    Family History Family History  Problem Relation Age of Onset  . Breast cancer Maternal Aunt 63    Allergies  Allergen Reactions  . Morphine And Related Other (See Comments)    Blurred vision  . Neosporin [Neomycin-Bacitracin Zn-Polymyx] Itching  . Polysporin [Bacitracin-Polymyxin B] Itching     REVIEW OF SYSTEMS (Negative unless checked)  Constitutional: [] Weight loss  [] Fever  [] Chills Cardiac: [] Chest pain   [] Chest pressure   [] Palpitations   [] Shortness of breath when laying flat   [] Shortness of breath with exertion. Vascular:  [] Pain in legs with walking    [] Pain in legs at rest  [] History of DVT   [] Phlebitis   [] Swelling in legs   [] Varicose veins   [] Non-healing ulcers Pulmonary:   [] Uses home oxygen   [] Productive cough   [] Hemoptysis   [] Wheeze  [] COPD   [] Asthma Neurologic:  [] Dizziness   [] Seizures   [] History of stroke   [] History of TIA  [] Aphasia   [] Vissual changes   [] Weakness or numbness in arm   [] Weakness or numbness in leg Musculoskeletal:   [] Joint swelling   [] Joint pain   [] Low back pain Hematologic:  [] Easy bruising  [] Easy bleeding   [] Hypercoagulable state   [] Anemic Gastrointestinal:  [] Diarrhea   [] Vomiting  [] Gastroesophageal reflux/heartburn   [] Difficulty swallowing. Genitourinary:  [] Chronic kidney disease   [] Difficult urination  [] Frequent urination   [] Blood in urine Skin:  [] Rashes   [] Ulcers  Psychological:  [] History of anxiety   []  History of major depression.  Physical Examination  There were no vitals filed for this visit. There is no height or weight on file to calculate BMI. Gen: WD/WN, NAD Head: [place]/AT, No temporalis wasting.  Ear/Nose/Throat: Hearing grossly intact, nares w/o erythema or drainage, poor dentition Eyes: PER, EOMI, sclera nonicteric.  Neck: Supple, no masses.  No bruit or JVD.  Pulmonary:  Good air movement, clear to auscultation bilaterally, no use of accessory muscles.  Cardiac: RRR, normal S1, S2, no Murmurs. Vascular:  Vessel Right Left  Radial Palpable Palpable  PT Palpable Palpable  DP  Palpable Palpable   Gastrointestinal: soft, non-distended. No guarding/no peritoneal signs.  Musculoskeletal: M/S 5/5 throughout.  No deformity or atrophy.  Neurologic: CN 2-12 intact. Pain and light touch intact in extremities.  Symmetrical.  Speech is fluent. Motor exam as listed above. Psychiatric: Judgment intact, Mood & affect appropriate for pt's clinical situation. Dermatologic: No rashes or ulcers noted.  No changes consistent with cellulitis. Lymph : No Cervical lymphadenopathy, no  lichenification or skin changes of chronic lymphedema.  CBC Lab Results  Component Value Date   WBC 6.1 04/16/2017   HGB 13.6 04/16/2017   HCT 39.0 04/16/2017   MCV 86.6 04/16/2017   PLT 285 04/16/2017    BMET    Component Value Date/Time   NA 138 04/16/2017 1030   NA 143 08/29/2014 1508   K 4.3 04/16/2017 1030   K 3.5 08/29/2014 1508   CL 103 04/16/2017 1030   CL 106 08/29/2014 1508   CO2 27 04/16/2017 1030   CO2 31 08/29/2014 1508   GLUCOSE 98 04/16/2017 1030   GLUCOSE 92 08/29/2014 1508   BUN 15 04/16/2017 1030   BUN 18 08/29/2014 1508   CREATININE 0.62 04/16/2017 1030   CREATININE 0.66 08/29/2014 1508   CALCIUM 9.2 04/16/2017 1030   CALCIUM 9.5 08/29/2014 1508   GFRNONAA >60 04/16/2017 1030   GFRNONAA >60 08/29/2014 1508   GFRNONAA >60 02/22/2014 1450   GFRAA >60 04/16/2017 1030   GFRAA >60 08/29/2014 1508   GFRAA >60 02/22/2014 1450   CrCl cannot be calculated (Patient's most recent lab result is older than the maximum 21 days allowed.).  COAG No results found for: INR, PROTIME  Radiology No results found.    Assessment/Plan 1. Raynaud's disease without gangrene Recommend:  The patient is currently tolerating the Raynaud's changes fairly well. Lengthy discussion regarding keeping the feet and the hands warm; gloves, washing with only warm water (especially avoiding cold water immersion) and using wool socks, specifically Smartwool was recommended.  Possibility of using Norvasc was discussed but it was decided to hold off for now until the benefits of conservative therapy is assessed.  The use of Pletal was also reviewed as well as the fact that it is an off label use which is typically reserved for failures of Norvasc and conservative therapy.  Also it is found to be useful in patients that have ulcerated.  The patient will follow up PRN if the changes worsen or persist.    2. Pain of lower extremity, unspecified laterality See #1   Hortencia Pilar, MD  12/29/2017 1:02 PM

## 2017-12-30 ENCOUNTER — Encounter (INDEPENDENT_AMBULATORY_CARE_PROVIDER_SITE_OTHER): Payer: Self-pay | Admitting: Vascular Surgery

## 2017-12-30 DIAGNOSIS — I7300 Raynaud's syndrome without gangrene: Secondary | ICD-10-CM | POA: Insufficient documentation

## 2017-12-30 DIAGNOSIS — M79606 Pain in leg, unspecified: Secondary | ICD-10-CM | POA: Insufficient documentation

## 2018-01-05 ENCOUNTER — Telehealth (INDEPENDENT_AMBULATORY_CARE_PROVIDER_SITE_OTHER): Payer: Self-pay

## 2018-01-05 NOTE — Telephone Encounter (Signed)
Patient called wanting to know if there is a summer compression stocking that she could wear due to the weather warming up. Per Dr. Delana Meyer she could wear a sock-like compression but there isn't a " summer compression hose ".

## 2018-02-27 ENCOUNTER — Telehealth (INDEPENDENT_AMBULATORY_CARE_PROVIDER_SITE_OTHER): Payer: Self-pay

## 2018-02-27 NOTE — Telephone Encounter (Signed)
Patient  called with a question about the co-pay that she made during her visit back in March?  She said that she made a $25 payment at the time of the visit and has received a separate bill for $25.  She would like a call back form someone that can help her with this question.

## 2018-04-17 ENCOUNTER — Encounter: Payer: Self-pay | Admitting: Internal Medicine

## 2018-04-17 ENCOUNTER — Inpatient Hospital Stay: Payer: Medicare Other | Attending: Internal Medicine

## 2018-04-17 ENCOUNTER — Inpatient Hospital Stay (HOSPITAL_BASED_OUTPATIENT_CLINIC_OR_DEPARTMENT_OTHER): Payer: Medicare Other | Admitting: Internal Medicine

## 2018-04-17 ENCOUNTER — Other Ambulatory Visit: Payer: Self-pay

## 2018-04-17 VITALS — BP 112/70 | HR 82 | Temp 97.5°F | Resp 18 | Ht 60.0 in | Wt 150.0 lb

## 2018-04-17 DIAGNOSIS — Z9229 Personal history of other drug therapy: Secondary | ICD-10-CM | POA: Insufficient documentation

## 2018-04-17 DIAGNOSIS — Z8572 Personal history of non-Hodgkin lymphomas: Secondary | ICD-10-CM

## 2018-04-17 DIAGNOSIS — C8442 Peripheral T-cell lymphoma, not classified, intrathoracic lymph nodes: Secondary | ICD-10-CM

## 2018-04-17 DIAGNOSIS — Z79899 Other long term (current) drug therapy: Secondary | ICD-10-CM | POA: Diagnosis not present

## 2018-04-17 DIAGNOSIS — Z7982 Long term (current) use of aspirin: Secondary | ICD-10-CM | POA: Diagnosis not present

## 2018-04-17 DIAGNOSIS — C84A0 Cutaneous T-cell lymphoma, unspecified, unspecified site: Secondary | ICD-10-CM

## 2018-04-17 LAB — CBC WITH DIFFERENTIAL/PLATELET
Basophils Absolute: 0 10*3/uL (ref 0–0.1)
Basophils Relative: 1 %
Eosinophils Absolute: 0.1 10*3/uL (ref 0–0.7)
Eosinophils Relative: 2 %
HEMATOCRIT: 39.3 % (ref 35.0–47.0)
Hemoglobin: 13.5 g/dL (ref 12.0–16.0)
LYMPHS ABS: 2.4 10*3/uL (ref 1.0–3.6)
LYMPHS PCT: 36 %
MCH: 30.5 pg (ref 26.0–34.0)
MCHC: 34.5 g/dL (ref 32.0–36.0)
MCV: 88.3 fL (ref 80.0–100.0)
MONO ABS: 0.5 10*3/uL (ref 0.2–0.9)
MONOS PCT: 7 %
NEUTROS ABS: 3.6 10*3/uL (ref 1.4–6.5)
Neutrophils Relative %: 54 %
Platelets: 267 10*3/uL (ref 150–440)
RBC: 4.45 MIL/uL (ref 3.80–5.20)
RDW: 13.5 % (ref 11.5–14.5)
WBC: 6.6 10*3/uL (ref 3.6–11.0)

## 2018-04-17 LAB — COMPREHENSIVE METABOLIC PANEL
ALBUMIN: 4 g/dL (ref 3.5–5.0)
ALK PHOS: 90 U/L (ref 38–126)
ALT: 27 U/L (ref 0–44)
ANION GAP: 9 (ref 5–15)
AST: 30 U/L (ref 15–41)
BUN: 17 mg/dL (ref 8–23)
CALCIUM: 8.9 mg/dL (ref 8.9–10.3)
CHLORIDE: 110 mmol/L (ref 98–111)
CO2: 23 mmol/L (ref 22–32)
Creatinine, Ser: 0.54 mg/dL (ref 0.44–1.00)
GFR calc Af Amer: >60 mL/min (ref >60)
GFR calc non Af Amer: >60 mL/min (ref >60)
GLUCOSE: 126 mg/dL — AB (ref 70–99)
POTASSIUM: 3.5 mmol/L (ref 3.5–5.1)
SODIUM: 142 mmol/L (ref 135–145)
Total Bilirubin: 0.5 mg/dL (ref 0.3–1.2)
Total Protein: 6.6 g/dL (ref 6.5–8.1)

## 2018-04-17 LAB — LACTATE DEHYDROGENASE: LDH: 125 U/L (ref 98–192)

## 2018-04-17 NOTE — Assessment & Plan Note (Addendum)
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  Stable.  Labs within normal limits.  #  Recommend continued follow-up with dermatology/ Dr. Nehemiah Massed  # follow up as needed/pt agrees.  Patient to follow-up.

## 2018-04-17 NOTE — Progress Notes (Signed)
[place] OFFICE PROGRESS NOTE  Patient Care Team: [facility] as PCP - General  Cancer Staging No matching staging information was found for the patient.   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  Patient had significant side effect to that treatment.  Then  was put on triamcinolone 0.1% ointment from October 22 30 February 2 007 with a good response. patient did not have any significant followup since then.  Patient continued to have a rash and some .nodule but refused biopsy.   2.[Dr.Choksi] biopsy in March of 2011inguinal positive for CD34 from the left side abdomen revealed atypical mononuclear cells infiltrate. 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  female pleasant patient above history of lymphoma here for follow-up.  Patient follows up with dermatology on a regular basis.  Denies any concerns for any new lumps or bumps or skin rash.  Patient denies any weight loss or night sweats.  Review of Systems  Constitutional: Negative for chills, diaphoresis, fever, malaise/fatigue and weight loss.  HENT: Negative for nosebleeds and sore throat.   Eyes: Negative for double vision.  Respiratory: Negative for cough, hemoptysis, sputum production, shortness of breath and wheezing.   Cardiovascular: Negative for chest pain, palpitations, orthopnea and leg swelling.  Gastrointestinal: Negative for abdominal pain, blood in stool, constipation, diarrhea, heartburn, melena, nausea and vomiting.  Genitourinary: Negative for dysuria, frequency and urgency.  Musculoskeletal: Negative for back pain and joint pain.  Skin: Negative.  Negative for  itching and rash.  Neurological: Negative for dizziness, tingling, focal weakness, weakness and headaches.  Endo/Heme/Allergies: Does not bruise/bleed easily.  Psychiatric/Behavioral: Negative for depression. The patient is not nervous/anxious and does not have insomnia.       PAST MEDICAL HISTORY :  Past Medical History:  Diagnosis Date  . 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 WISDOM TOOTH EXTRACTION      FAMILY HISTORY :   Family History  Problem Relation Age of Onset  . Breast cancer Maternal Aunt 63    SOCIAL HISTORY:   Social History   Tobacco Use  . Smoking status: Never Smoker  . Smokeless tobacco: Never Used  Substance Use Topics  . Alcohol use: No    Frequency: Never  . Drug use: No    ALLERGIES:  is allergic to morphine and related; neosporin [neomycin-bacitracin zn-polymyx]; and polysporin [bacitracin-polymyxin b].  MEDICATIONS:  Current Outpatient Medications  Medication Sig Dispense Refill  . Ascorbic Acid (VITAMIN C) 1000 MG tablet Take 1,000 mg by mouth daily.    Marland Kitchen aspirin 81 MG tablet Take 81 mg by mouth daily. Reported on 04/10/2016    . Cholecalciferol (VITAMIN D-3) 1000 UNITS CAPS Take 1 capsule by mouth daily at 6 (six) AM. Reported on 04/10/2016    . loratadine-pseudoephedrine (CLARITIN-D 24 HOUR) 10-240 MG 24 hr tablet every other day    . NON FORMULARY Take 1 capsule by mouth daily. Tumeric    . acetaminophen (TYLENOL) 325 MG tablet Take 650 mg by mouth every 4 (four) hours as needed. Reported  on 04/10/2016    . fluticasone (FLONASE) 50 MCG/ACT nasal spray      No current facility-administered medications for this visit.     PHYSICAL EXAMINATION: ECOG PERFORMANCE STATUS: 0 - Asymptomatic  BP 112/70 (BP Location: Right Arm)   Pulse 82   Temp (!) 97.5 F (36.4 C) (Tympanic)   Resp 18   Ht 5' (1.524 m)   Wt 150 lb (68 kg)   BMI 29.29 kg/m   Filed  Weights   04/17/18 1359  Weight: 150 lb (68 kg)    GENERAL: Well-nourished well-developed; Alert, no distress and comfortable.  Alone. EYES: no pallor or icterus OROPHARYNX: no thrush or ulceration; NECK: supple; no lymph nodes felt. LYMPH:  no palpable lymphadenopathy in the axillary or inguinal regions LUNGS: Decreased breath sounds auscultation bilaterally. No wheeze or crackles HEART/CVS: regular rate & rhythm and no murmurs; No lower extremity edema ABDOMEN:abdomen soft, non-tender and normal bowel sounds. No hepatomegaly or splenomegaly.  Musculoskeletal:no cyanosis of digits and no clubbing  PSYCH: alert & oriented x 3 with fluent speech NEURO: no focal motor/sensory deficits SKIN:  no rashes or significant lesions    LABORATORY DATA:  I have reviewed the data as listed    Component Value Date/Time   NA 142 04/17/2018 1333   NA 143 08/29/2014 1508   K 3.5 04/17/2018 1333   K 3.5 08/29/2014 1508   CL 110 04/17/2018 1333   CL 106 08/29/2014 1508   CO2 23 04/17/2018 1333   CO2 31 08/29/2014 1508   GLUCOSE 126 (H) 04/17/2018 1333   GLUCOSE 92 08/29/2014 1508   BUN 17 04/17/2018 1333   BUN 18 08/29/2014 1508   CREATININE 0.54 04/17/2018 1333   CREATININE 0.66 08/29/2014 1508   CALCIUM 8.9 04/17/2018 1333   CALCIUM 9.5 08/29/2014 1508   PROT 6.6 04/17/2018 1333   PROT 6.7 08/29/2014 1508   ALBUMIN 4.0 04/17/2018 1333   ALBUMIN 3.8 08/29/2014 1508   AST 30 04/17/2018 1333   AST 12 (L) 08/29/2014 1508   ALT 27 04/17/2018 1333   ALT 24 08/29/2014 1508   ALKPHOS 90 04/17/2018 1333   ALKPHOS 107 08/29/2014 1508   BILITOT 0.5 04/17/2018 1333   BILITOT 0.4 08/29/2014 1508   GFRNONAA >60 04/17/2018 1333   GFRNONAA >60 08/29/2014 1508   GFRNONAA >60 02/22/2014 1450   GFRAA >60 04/17/2018 1333   GFRAA >60 08/29/2014 1508   GFRAA >60 02/22/2014 1450    No results found for: SPEP, UPEP  Lab Results  Component Value Date   WBC 6.6 04/17/2018   NEUTROABS 3.6  04/17/2018   HGB 13.5 04/17/2018   HCT 39.3 04/17/2018   MCV 88.3 04/17/2018   PLT 267 04/17/2018      Chemistry      Component Value Date/Time   NA 142 04/17/2018 1333   NA 143 08/29/2014 1508   K 3.5 04/17/2018 1333   K 3.5 08/29/2014 1508   CL 110 04/17/2018 1333   CL 106 08/29/2014 1508   CO2 23 04/17/2018 1333   CO2 31 08/29/2014 1508   BUN 17 04/17/2018 1333   BUN 18 08/29/2014 1508   CREATININE 0.54 04/17/2018 1333   CREATININE 0.66 08/29/2014 1508      Component Value Date/Time   CALCIUM 8.9 04/17/2018 1333   CALCIUM 9.5 08/29/2014 1508   ALKPHOS 90 04/17/2018 1333   ALKPHOS 107 08/29/2014 1508   AST 30 04/17/2018 1333   AST 12 (L) 08/29/2014 1508  ALT 27 04/17/2018 1333   ALT 24 08/29/2014 1508   BILITOT 0.5 04/17/2018 1333   BILITOT 0.4 08/29/2014 1508       RADIOGRAPHIC STUDIES: I have personally reviewed the radiological images as listed and agreed with the findings in the report. No results found.   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  Stable.  Labs within normal limits.  #  Recommend continued follow-up with dermatology/ Dr. Nehemiah Massed  # follow up as needed/pt agrees.  Patient to follow-up.    No orders of the defined types were placed in this encounter.  All questions were answered. The patient knows to call the clinic with any problems, questions or concerns.      Cammie Sickle, MD 04/19/2018 10:45 AM

## 2018-11-02 ENCOUNTER — Encounter: Payer: Self-pay | Admitting: Dermatology

## 2018-12-16 ENCOUNTER — Other Ambulatory Visit: Payer: Self-pay | Admitting: Gastroenterology

## 2018-12-16 DIAGNOSIS — R140 Abdominal distension (gaseous): Secondary | ICD-10-CM

## 2018-12-16 DIAGNOSIS — R1033 Periumbilical pain: Secondary | ICD-10-CM

## 2018-12-16 DIAGNOSIS — R6881 Early satiety: Secondary | ICD-10-CM

## 2018-12-16 DIAGNOSIS — R110 Nausea: Secondary | ICD-10-CM

## 2018-12-23 ENCOUNTER — Ambulatory Visit: Payer: Medicare Other

## 2018-12-25 LAB — HM COLONOSCOPY

## 2020-03-14 ENCOUNTER — Other Ambulatory Visit: Payer: Self-pay | Admitting: Family Medicine

## 2020-03-14 DIAGNOSIS — Z1231 Encounter for screening mammogram for malignant neoplasm of breast: Secondary | ICD-10-CM

## 2020-05-18 ENCOUNTER — Ambulatory Visit
Admission: RE | Admit: 2020-05-18 | Discharge: 2020-05-18 | Disposition: A | Discharge status: Home / Self Care | Payer: Medicare Other | Source: Ambulatory Visit | Attending: Family Medicine | Admitting: Family Medicine

## 2020-05-18 DIAGNOSIS — Z1231 Encounter for screening mammogram for malignant neoplasm of breast: Secondary | ICD-10-CM | POA: Diagnosis present

## 2020-08-28 ENCOUNTER — Ambulatory Visit: Payer: Medicare Other | Admitting: Dermatology

## 2020-08-28 ENCOUNTER — Other Ambulatory Visit: Payer: Self-pay

## 2020-08-28 DIAGNOSIS — L720 Epidermal cyst: Secondary | ICD-10-CM | POA: Diagnosis not present

## 2020-08-28 DIAGNOSIS — C84A0 Cutaneous T-cell lymphoma, unspecified, unspecified site: Secondary | ICD-10-CM | POA: Diagnosis not present

## 2020-08-28 DIAGNOSIS — L738 Other specified follicular disorders: Secondary | ICD-10-CM

## 2020-08-28 DIAGNOSIS — L814 Other melanin hyperpigmentation: Secondary | ICD-10-CM

## 2020-08-28 DIAGNOSIS — L821 Other seborrheic keratosis: Secondary | ICD-10-CM

## 2020-08-28 DIAGNOSIS — L820 Inflamed seborrheic keratosis: Secondary | ICD-10-CM

## 2020-08-28 DIAGNOSIS — L853 Xerosis cutis: Secondary | ICD-10-CM

## 2020-08-28 DIAGNOSIS — L578 Other skin changes due to chronic exposure to nonionizing radiation: Secondary | ICD-10-CM

## 2020-08-28 NOTE — Patient Instructions (Signed)
Recommend Differin 0.1% gel to the face at night. To be purchased over the counter.

## 2020-08-28 NOTE — Progress Notes (Signed)
Follow-Up Visit   Subjective  Jeanne Lewis is a 69 y.o. female who presents for the following: Lesions (on the face and back that patient would like checked) and CTCL (patient currently using OTC lotion but no prescription medication. She was being followed by oncology but they recommended she follow up with dermatology). Path and records reviewed.  The following portions of the chart were reviewed this encounter and updated as appropriate:  Tobacco  Allergies  Meds  Problems  Med Hx  Surg Hx  Fam Hx     Review of Systems:  No other skin or systemic complaints except as noted in HPI or Assessment and Plan.  Objective  Well appearing patient in no apparent distress; mood and affect are within normal limits.  A focused examination was performed including the face, trunk, and extremities. Relevant physical exam findings are noted in the Assessment and Plan.  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           Objective  Face: White papules   Objective  Face: Small yellow papules with a central dell.   Objective  Back x 8, neck x 9, L shoulder x 1 (18): Erythematous keratotic or waxy stuck-on papule or plaque.    Assessment & Plan  Cutaneous T-cell lymphoma, generalized.  Chronic persistent condition.  Currently calm. B/L arms, legs Biopsy proven -   FINAL DIAGNOSIS and MICROSCOPIC DESCRIPTION Diagnosis Skin , (A) right medial thigh, punch ATYPICAL MONONUCLEAR CELL INFILTRATE  Microscopic Description There is a proliferation of mononuclear cells in a slightly thickened papillary dermis. The cells focally abut the  basement membranes and an occasional cell is present within the epidermis where there is a little spongiosis. These  mononuclear cells have slight nuclear hyperchromaticity but no prominent atypia. The findings are not completely  diagnostic, but this pattern can be seen in early lesions of evolving patch stage mycosis  fungoides. Similar changes,  however, can be secondary to some drug eruptions.   T Cell receptor gene rearrangement studies are recommended which can be performed on this block. ADDENDUM: T cell receptor gamma gene rearrangement studies are performed at [facility]. The result is  negative by PCR analysis. This does not exclude a clonal T cell population or CTCL/MF, as up to 20% of T cell  lymphoproliferative disorders can be negative by PCR evaluation. Thus, clinical correlation and follow-up with rebiopsy  may be advisable. This result will be faxed. (NDS:kh 11/20/2018)  Per Dr. 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  Patient had significant side effect to that treatment.  Then  was put on triamcinolone 0.1% ointment from October 22 30 February 2 007 with a good response. patient did not have any significant followup since then.  Patient continued to have a rash and some .nodule but refused biopsy.     2.[Dr.Choksi] biopsy in March of 2011inguinal positive for CD34 from the left side abdomen revealed atypical mononuclear cells infiltrate. 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  Stable.  Labs within normal limits.   #  Recommend continued follow-up  with dermatology/ Dr. Nehemiah Massed   # follow up as needed/pt agrees.  Patient to follow-up.      No orders of the defined types were placed in this encounter.   All questions were answered. The patient knows to call the clinic with any problems, questions or concerns."  Above is note from [place] - Dr Rogue Bussing.  No tx needed at this time -  will follow.   [place] Face Recommend OTC Differin 0.1% gel QD   Sebaceous hyperplasia of face Face Recommend Differin 0.1% gel QD   Inflamed seborrheic keratosis (18) Back x 8, neck x 9, L shoulder x 1  Destruction of lesion - Back x 8, neck x 9, L shoulder x 1 Complexity: simple   Destruction method: cryotherapy   Informed consent: discussed and consent obtained   Timeout:  patient name, date of birth, surgical site, and procedure verified Lesion destroyed using liquid nitrogen: Yes   Region frozen until ice ball extended beyond lesion: Yes   Outcome: patient tolerated procedure well with no complications   Post-procedure details: wound care instructions given    Lentigines - Scattered tan macules - Discussed due to sun exposure - Benign, observe - Call for any changes  Seborrheic Keratoses - Stuck-on, waxy, tan-brown papules and plaques  - Discussed benign etiology and prognosis. - Observe - Call for any changes  Actinic Damage - chronic, secondary to cumulative UV radiation exposure/sun exposure over time - diffuse scaly erythematous macules with underlying dyspigmentation - Recommend daily broad spectrum sunscreen SPF 30+ to sun-exposed areas, reapply every 2 hours as needed.  - Call for new or changing lesions.  Xerosis - diffuse xerotic patches - recommend gentle, hydrating skin care - gentle skin care handout given  Return in about 4 months (around 12/26/2020).  Luther Redo, CMA, am acting as scribe for Sarina Ser, MD .  Documentation: I have reviewed the above documentation for accuracy and completeness, and I agree with the above.  Sarina Ser, MD

## 2020-08-29 ENCOUNTER — Encounter: Payer: Self-pay | Admitting: Dermatology

## 2021-01-01 ENCOUNTER — Ambulatory Visit: Payer: Medicare Other | Admitting: Dermatology

## 2021-02-01 ENCOUNTER — Ambulatory Visit: Payer: Medicare Other | Admitting: Dermatology

## 2021-02-28 ENCOUNTER — Ambulatory Visit: Payer: Medicare Other | Admitting: Dermatology

## 2021-02-28 ENCOUNTER — Other Ambulatory Visit: Payer: Self-pay

## 2021-02-28 DIAGNOSIS — R21 Rash and other nonspecific skin eruption: Secondary | ICD-10-CM

## 2021-02-28 DIAGNOSIS — L820 Inflamed seborrheic keratosis: Secondary | ICD-10-CM

## 2021-02-28 DIAGNOSIS — L821 Other seborrheic keratosis: Secondary | ICD-10-CM | POA: Diagnosis not present

## 2021-02-28 NOTE — Progress Notes (Signed)
   Follow-Up Visit   Subjective  Jeanne Lewis is a 70 y.o. female who presents for the following: Follow-up (ISK follow up of back, neck, left shoulder treated with LN2 ) and Other (History of CTCL - Biopsy proven. She was treated by Dr Adelene Idler in 2003. She was treated in the past with PUVA. She states that she was supposed to be referred to a study for CTCL but was never started due to [place].).   The following portions of the chart were reviewed this encounter and updated as appropriate:   Tobacco  Allergies  Meds  Problems  Med Hx  Surg Hx  Fam Hx     Review of Systems:  No other skin or systemic complaints except as noted in HPI or Assessment and Plan.  Objective  Well appearing patient in no apparent distress; mood and affect are within normal limits.  All skin waist up examined.  Objective  Back (16): Erythematous keratotic or waxy stuck-on papule or plaque.   Objective  Trunk: Generalized pinkness   Assessment & Plan    Seborrheic Keratoses - Stuck-on, waxy, tan-brown papules and/or plaques  - Benign-appearing - Discussed benign etiology and prognosis. - Observe - Call for any changes   Inflamed seborrheic keratosis (16) Back  Destruction of lesion - Back Complexity: simple   Destruction method: cryotherapy   Informed consent: discussed and consent obtained   Timeout:  patient name, date of birth, surgical site, and procedure verified Lesion destroyed using liquid nitrogen: Yes   Region frozen until ice ball extended beyond lesion: Yes   Outcome: patient tolerated procedure well with no complications   Post-procedure details: wound care instructions given    Rash - CTCL.  May be persistent.  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   Pt lost to follow up until recently.   Pt also was lost to follow up between 2003 and 2020. Trunk Chronic;  recurrent. Possible flare of CTCL - Biopsy proven in past. She was treated by Dr Adelene Idler in 2003. She was treated in the past with PUVA. She states that she was supposed to be referred to a study for CTCL but never started due to cancellation for COVID pandemic. Also seen by Dr Rogue Bussing in Oncology in 2019. Recommend evaluation with Dr. Adelene Idler. We will make a referral.  Ambulatory referral to Dermatology - Trunk  Return for 3-4 months, Follow up.   I, Ashok Cordia, CMA, am acting as scribe for Sarina Ser, MD .  Documentation: I have reviewed the above documentation for accuracy and completeness, and I agree with the above.  Sarina Ser, MD

## 2021-02-28 NOTE — Patient Instructions (Signed)

## 2021-03-01 ENCOUNTER — Encounter: Payer: Self-pay | Admitting: Dermatology

## 2021-04-11 IMAGING — MG DIGITAL SCREENING BILAT W/ TOMO W/ CAD
8 series · 8 of 24 positions shown · non-contrast
Comparison: Previous exam(s).

CLINICAL DATA: Screening.

EXAM:
DIGITAL SCREENING BILATERAL MAMMOGRAM WITH TOMO AND CAD

[R CC synth-2D]
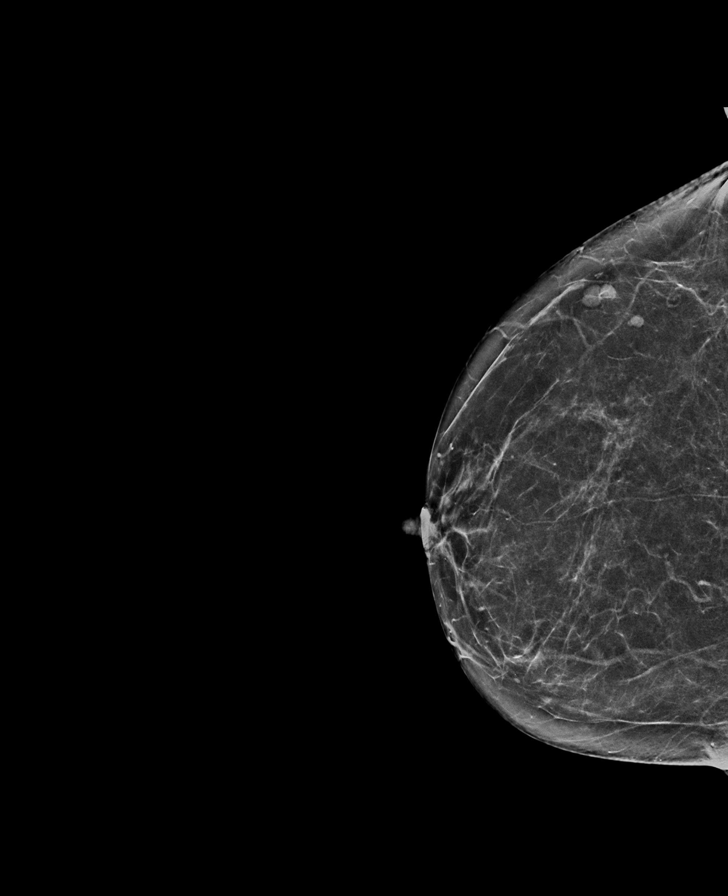

[L CC synth-2D]
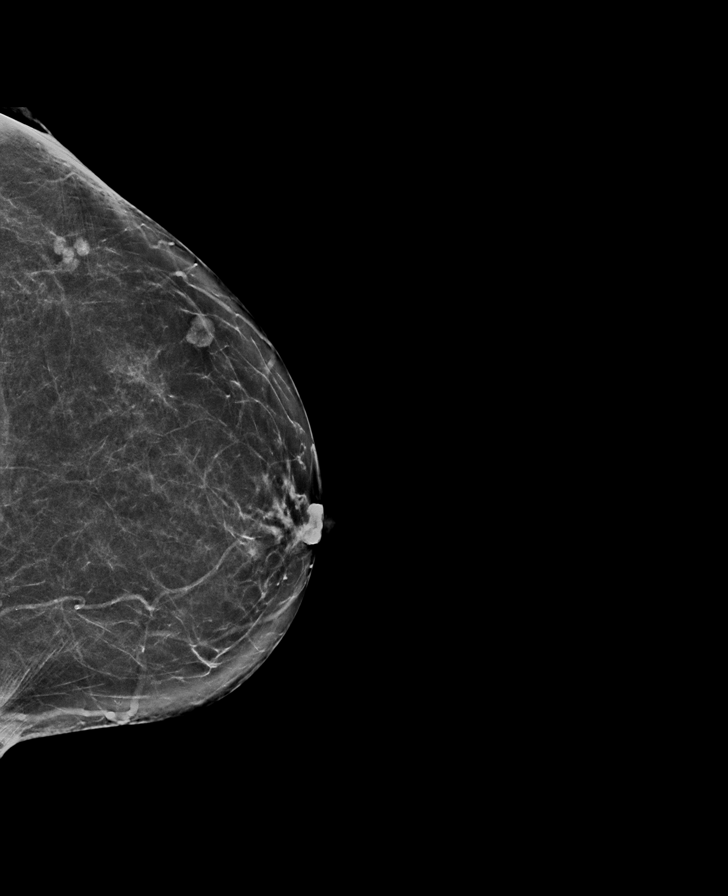

[R MLO synth-2D]
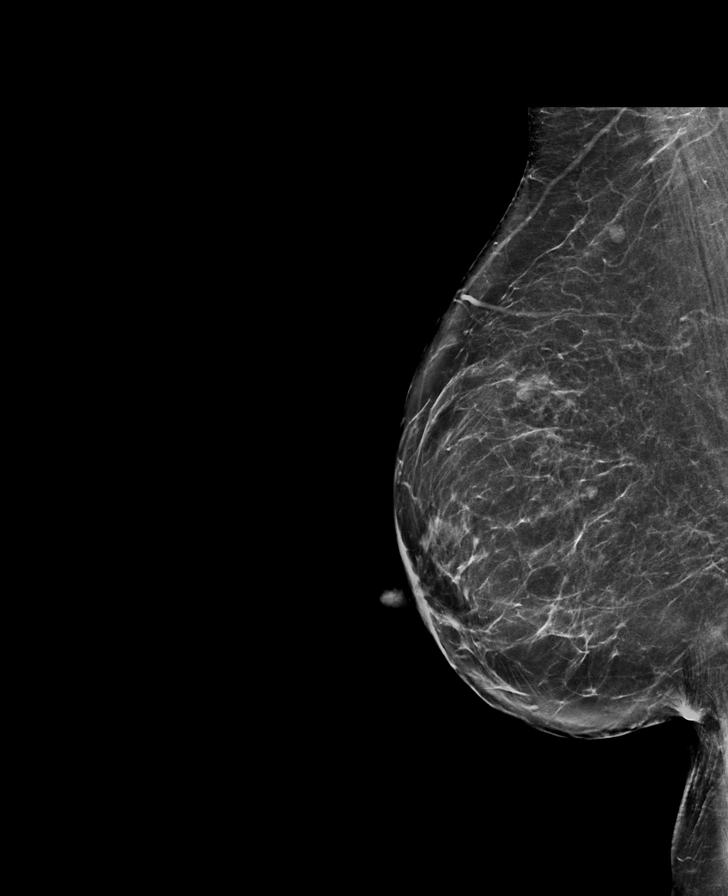

[L MLO synth-2D]
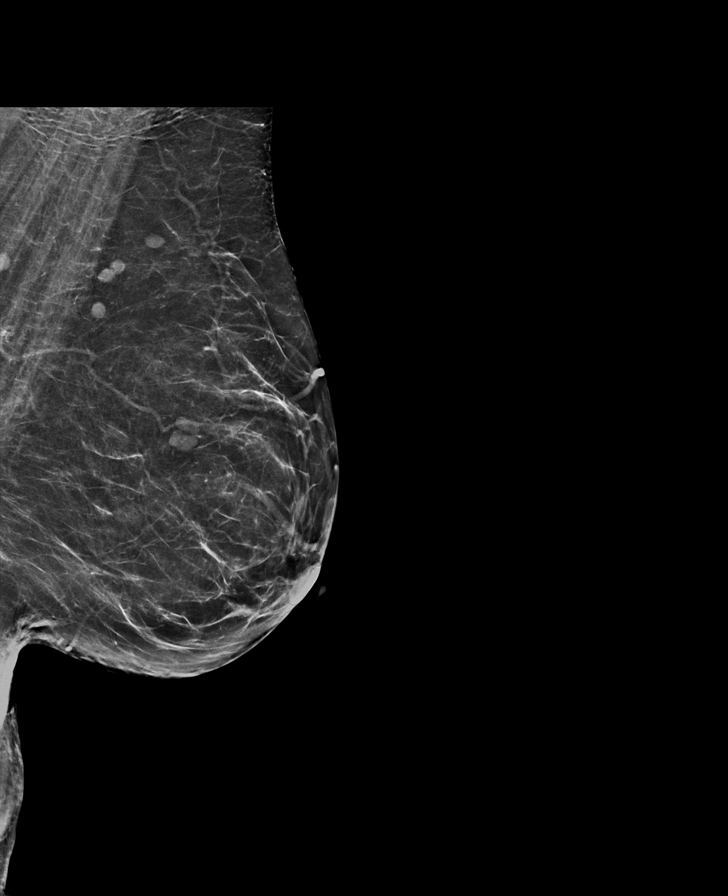

[L CC tomo · tomo slice 33/65.0]
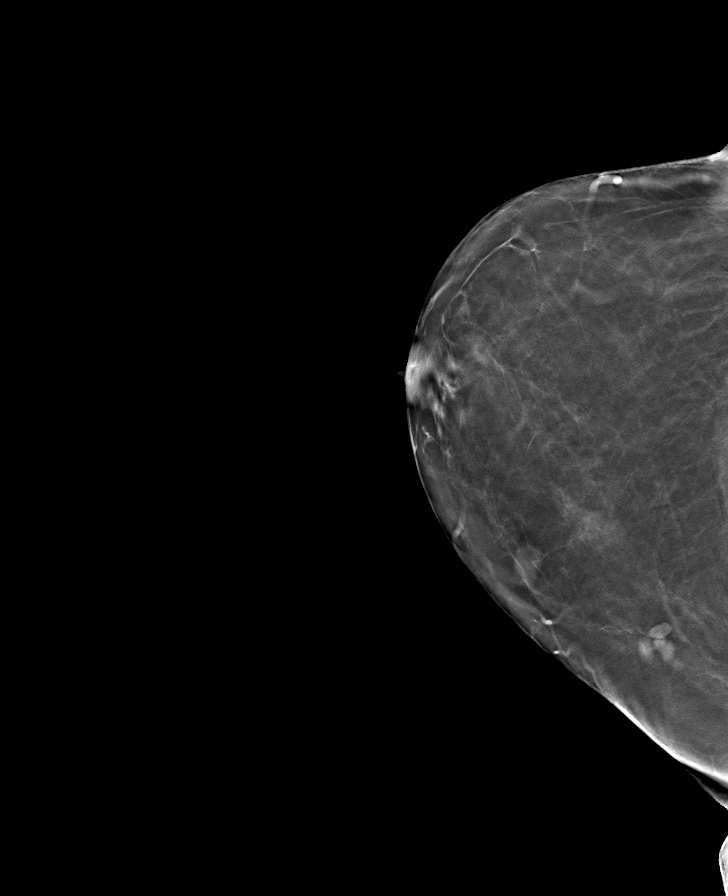

[L MLO tomo · tomo slice 34/67.0]
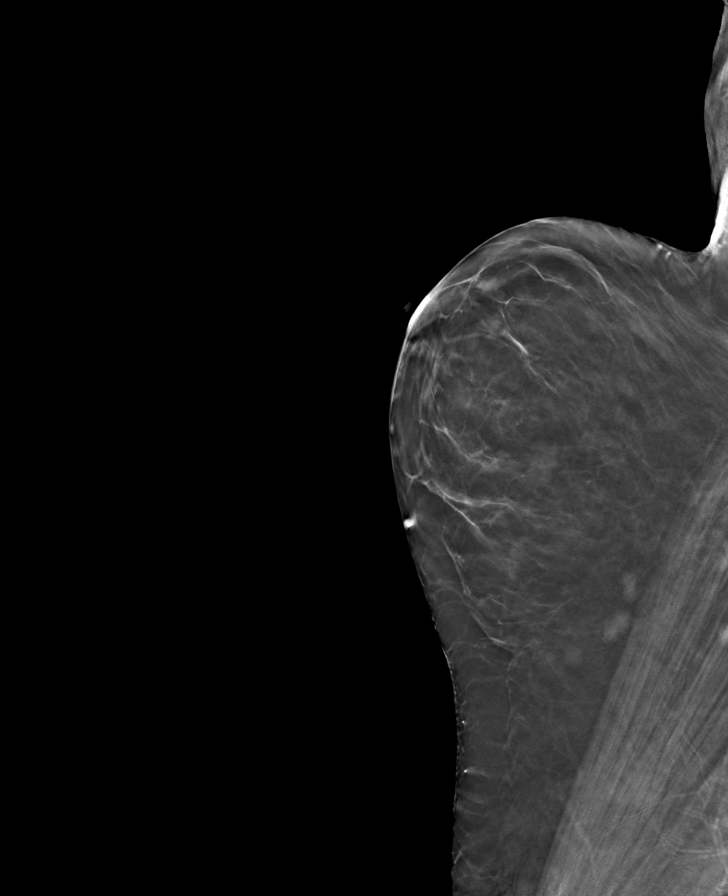

[R CC tomo · tomo slice 33/64.0]
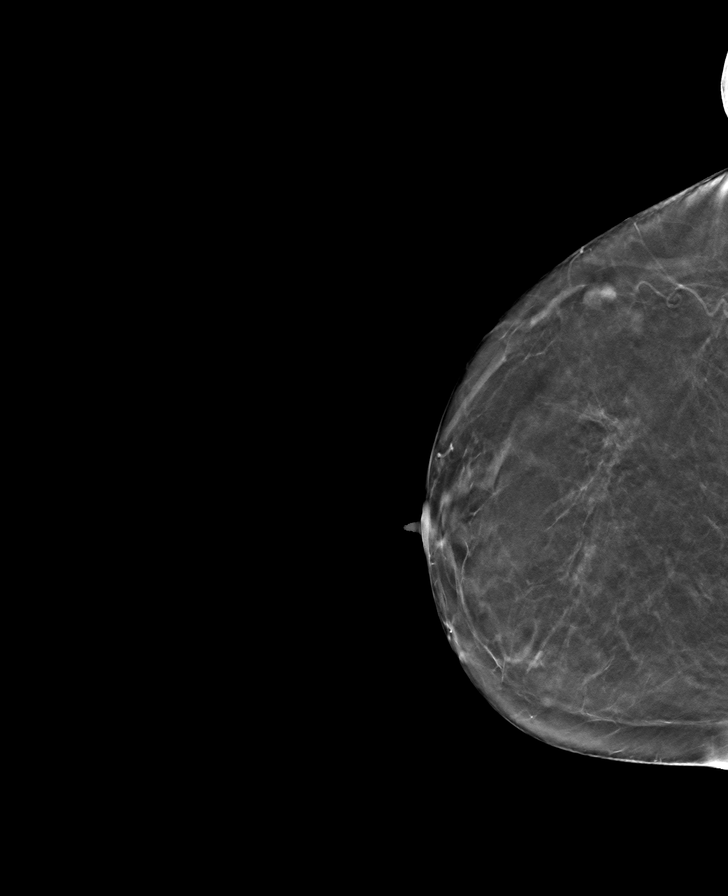

[R MLO tomo · tomo slice 35/69.0]
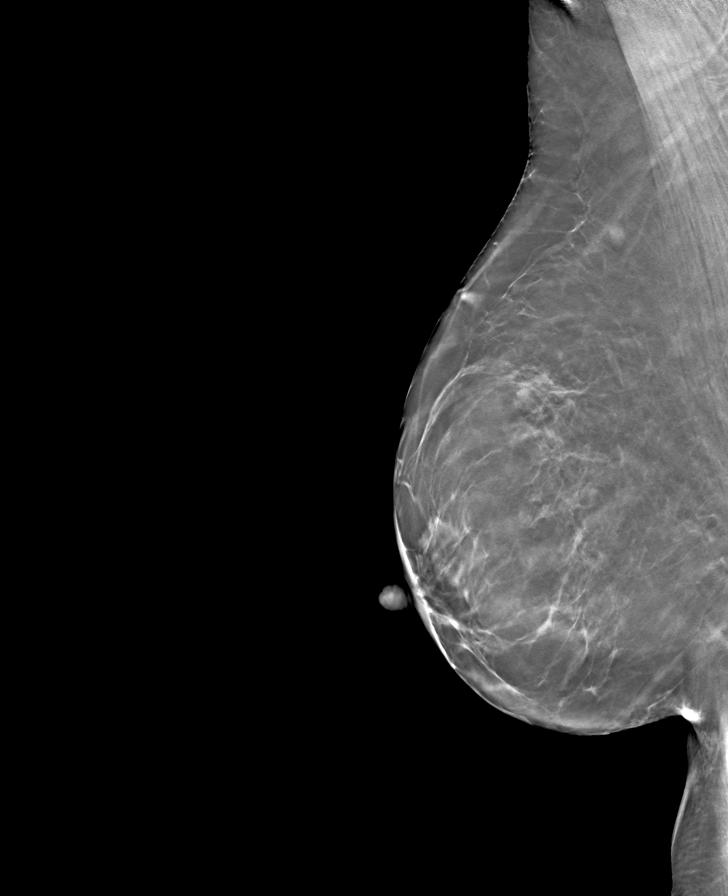

[8 of 24 positions shown; findings below may reference images not displayed]

ACR Breast Density Category b: There are scattered areas of
fibroglandular density.
FINDINGS: There are no findings suspicious for malignancy. Images were
processed with CAD.
IMPRESSION: No mammographic evidence of malignancy. A result letter of this
screening mammogram will be mailed directly to the patient.

RECOMMENDATION:
Screening mammogram in one year. (Code:[id number])

BI-RADS CATEGORY  1: Negative.

## 2021-06-28 ENCOUNTER — Telehealth: Payer: Self-pay

## 2021-06-28 NOTE — Telephone Encounter (Signed)
Patient has been scheduled for Dr. Irish Elders on October 4th at 8:30. Referral was placed and in the system since June. Patient called and advised.

## 2021-07-02 ENCOUNTER — Ambulatory Visit: Payer: Medicare Other | Admitting: Dermatology

## 2021-10-29 ENCOUNTER — Other Ambulatory Visit: Payer: Self-pay

## 2021-10-29 ENCOUNTER — Ambulatory Visit: Payer: Medicare Other | Admitting: Dermatology

## 2021-10-29 ENCOUNTER — Encounter: Payer: Self-pay | Admitting: Dermatology

## 2021-10-29 DIAGNOSIS — C8400 Mycosis fungoides, unspecified site: Secondary | ICD-10-CM

## 2021-10-29 DIAGNOSIS — D1800 Hemangioma unspecified site: Secondary | ICD-10-CM | POA: Diagnosis not present

## 2021-10-29 DIAGNOSIS — R21 Rash and other nonspecific skin eruption: Secondary | ICD-10-CM

## 2021-10-29 DIAGNOSIS — L820 Inflamed seborrheic keratosis: Secondary | ICD-10-CM | POA: Diagnosis not present

## 2021-10-29 DIAGNOSIS — L720 Epidermal cyst: Secondary | ICD-10-CM

## 2021-10-29 DIAGNOSIS — D229 Melanocytic nevi, unspecified: Secondary | ICD-10-CM

## 2021-10-29 NOTE — Patient Instructions (Addendum)
Arazlo samples given today. Apply at bedtime to affected areas on forehead, wash off in morning.   Topical retinoid medications like tretinoin/Retin-A, adapalene/Differin, tazarotene/Fabior, and Epiduo/Epiduo Forte can cause dryness and irritation when first started. Only apply a pea-sized amount to the entire affected area. Avoid applying it around the eyes, edges of mouth and creases at the nose. If you experience irritation, use a good moisturizer first and/or apply the medicine less often. If you are doing well with the medicine, you can increase how often you use it until you are applying every night. Be careful with sun protection while using this medication as it can make you sensitive to the sun. This medicine should not be used by pregnant women.     If You Need Anything After Your Visit  If you have any questions or concerns for your doctor, please call our main line at [phone] and press option 4 to reach your doctor's medical assistant. If no one answers, please leave a voicemail as directed and we will return your call as soon as possible. Messages left after 4 pm will be answered the following business day.   You may also send [place] a message via [place]. We typically respond to MyChart messages within 1-2 business days.  For prescription refills, please ask your pharmacy to contact our office. Our fax number is [phone].  If you have an urgent issue when the clinic is closed that cannot wait until the next business day, you can page your doctor at the number below.    Please note that while we do our best to be available for urgent issues outside of office hours, we are not available 24/7.   If you have an urgent issue and are unable to reach [place], you may choose to seek medical care at your doctor's office, retail clinic, urgent care center, or emergency room.  If you have a medical emergency, please immediately call 911 or go to the emergency department.  Pager Numbers  -  Dr. Nehemiah Massed: [phone]  - Dr. Laurence Ferrari: [phone]  - Dr. Nicole Kindred: [phone]  In the event of inclement weather, please call our main line at [phone] for an update on the status of any delays or closures.  Dermatology Medication Tips: Please keep the boxes that topical medications come in in order to help keep track of the instructions about where and how to use these. Pharmacies typically print the medication instructions only on the boxes and not directly on the medication tubes.   If your medication is too expensive, please contact our office at [phone] option 4 or send [place] a message through [place].   We are unable to tell what your co-pay for medications will be in advance as this is different depending on your insurance coverage. However, we may be able to find a substitute medication at lower cost or fill out paperwork to get insurance to cover a needed medication.   If a prior authorization is required to get your medication covered by your insurance company, please allow [place] 1-2 business days to complete this process.  Drug prices often vary depending on where the prescription is filled and some pharmacies may offer cheaper prices.  The website www.goodrx.com contains coupons for medications through different pharmacies. The prices here do not account for what the cost may be with help from insurance (it may be cheaper with your insurance), but the website can give you the price if you did not use any insurance.  - You can  print the associated coupon and take it with your prescription to the pharmacy.  - You may also stop by our office during regular business hours and pick up a GoodRx coupon card.  - If you need your prescription sent electronically to a different pharmacy, notify our office through [facility] or by phone at [phone] option 4.     Si Usted Necesita Algo Despus de Su Visita  Tambin puede enviarnos un mensaje a travs de Pharmacist, community. Por  lo general respondemos a los mensajes de MyChart en el transcurso de 1 a 2 das hbiles.  Para renovar recetas, por favor pida a su farmacia que se ponga en contacto con nuestra oficina. Harland Dingwall de fax es [place] [phone].  Si tiene un asunto urgente cuando la clnica est cerrada y que no puede esperar hasta el siguiente da hbil, puede llamar/localizar a su doctor(a) al nmero que aparece a continuacin.   Por favor, tenga en cuenta que aunque hacemos todo lo posible para estar disponibles para asuntos urgentes fuera del horario de [place], no estamos disponibles las 24 horas del da, los 7 das de la [place].   Si tiene un problema urgente y no puede comunicarse con nosotros, puede optar por buscar atencin mdica  en el consultorio de su doctor(a), en una clnica privada, en un centro de atencin urgente o en una sala de emergencias.  Si tiene Engineering geologist, por favor llame inmediatamente al 911 o vaya a la sala de emergencias.  Nmeros de bper  - Dr. Nehemiah Massed: [phone]  - Dra. Moye: [phone]  - Dra. Nicole Kindred: [phone]  En caso de inclemencias del [place], por favor llame a Johnsie Kindred principal al [phone] para una actualizacin sobre el [place] de cualquier retraso o cierre.  Consejos para la medicacin en dermatologa: Por favor, guarde las cajas en las que vienen los medicamentos de uso tpico para ayudarle a seguir las instrucciones sobre dnde y cmo usarlos. Las farmacias generalmente imprimen las instrucciones del medicamento slo en las cajas y no directamente en los tubos del [place].   Si su medicamento es muy caro, por favor, pngase en contacto con Zigmund Daniel llamando al [phone] y presione la opcin 4 o envenos un mensaje a travs de Pharmacist, community.   No podemos decirle cul ser su copago por los medicamentos por adelantado ya que esto es diferente dependiendo de la cobertura de su seguro. Sin embargo, es posible que podamos encontrar  un medicamento sustituto a Electrical engineer un formulario para que el seguro cubra el medicamento que se considera necesario.   Si se requiere una autorizacin previa para que su compaa de seguros [place] su medicamento, por favor permtanos de 1 a 2 das hbiles para completar este proceso.  Los precios de los medicamentos varan con frecuencia dependiendo del Environmental consultant de dnde se surte la receta y alguna farmacias pueden ofrecer precios ms baratos.  El sitio web www.goodrx.com tiene cupones para medicamentos de Airline pilot. Los precios aqu no tienen en cuenta lo que podra costar con la ayuda del seguro (puede ser ms barato con su seguro), pero el sitio web puede darle el precio si no utiliz Research scientist (physical sciences).  - Puede imprimir el cupn correspondiente y llevarlo con su receta a la farmacia.  - Tambin puede pasar por nuestra oficina durante el horario de atencin regular y Charity fundraiser una tarjeta de cupones de GoodRx.  - Si necesita que su receta se enve electrnicamente a una farmacia diferente, informe a [place]  oficina a travs de MyChart [place] o por telfono llamando al [phone] y presione la opcin 4.

## 2021-10-29 NOTE — Progress Notes (Signed)
Follow-Up Visit   Subjective  Jeanne Lewis is a 71 y.o. female who presents for the following: Rash (Hx CTCL. Patient states we should have records in [place] from doctors at [facility]. Taking MTX and Folic acid since fall 9357. States has not improved since starting this medication. 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 Has been seen by Dr. Irish Elders since her last visit here. ) and Follow-up (Hx of multiple ISK's on back Tx with LN2 at last visit. ). The patient presents for Upper Body Skin Exam (UBSE) for skin cancer screening and mole check.  The patient has spots, moles and lesions to be evaluated, some may be new or changing and the patient has concerns that these could be cancer.  She has an irritating lesion on the neck.  The following portions of the chart were reviewed this encounter and updated as appropriate:  Tobacco   Allergies   Meds   Problems   Med Hx   Surg Hx   Fam Hx      Review of Systems: No other skin or systemic complaints except as noted in HPI or Assessment and Plan.  Objective  Well appearing patient in no apparent distress; mood and affect are within normal limits.  All skin waist up examined and legs.  Pt did not take socks off.  torso, arms, legs Generalized pinkness and fine scale  Right Anterior Neck x1 Erythematous keratotic or waxy stuck-on papule or plaque.  Forehead Smooth white papule(s).    Assessment & Plan  Rash torso, arms, legs CTCL.  Mycosis Fungoides. Persistent.  Pt has been non-compliant with follow ups with dermatology. 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   Pt lost to follow up at [facility] from 2003 until 2020 and pt returned again in 2021.  She had been seen at [facility] dermatology as recently as 2009. Patient sent for reevaluation with Dr. Jeannine Kitten who she saw recently and Dr. Jeannine Kitten did new biopsy October 2022 showing CTCL and  Dr Irish Elders put patient on methotrexate and folic acid.  Continue current treatment regimen of MTX and Folic acid from Dr. Irish Elders.  Follow-up with Dr. Irish Elders in February 2023.  Inflamed seborrheic keratosis Right Anterior Neck x1 Destruction of lesion - Right Anterior Neck x1 Complexity: simple   Destruction method: cryotherapy   Informed consent: discussed and consent obtained   Timeout:  patient name, date of birth, surgical site, and procedure verified Lesion destroyed using liquid nitrogen: Yes   Region frozen until ice ball extended beyond lesion: Yes   Outcome: patient tolerated procedure well with no complications   Post-procedure details: wound care instructions given    Milia Forehead Discussed Rx retinoid. Patient deferred treatment at this time. Arazlo samples given today. Apply at bedtime to affected areas on forehead, wash off in morning.   Hemangiomas - Red papules - Discussed benign nature - Observe - Call for any changes  Melanocytic Nevi - Tan-brown and/or pink-flesh-colored symmetric macules and papules - Benign appearing on exam today - Observation - Call clinic for new or changing moles - Recommend daily use of broad spectrum spf 30+ sunscreen to sun-exposed areas.   Return for follow up in 6-12 months.  I, Emelia Salisbury, CMA, am acting as scribe for Sarina Ser, MD. Documentation: I have reviewed the above documentation for accuracy and completeness, and I agree with the above.  Sarina Ser, MD

## 2021-10-30 ENCOUNTER — Encounter: Payer: Self-pay | Admitting: Dermatology

## 2021-12-04 DIAGNOSIS — C8400 Mycosis fungoides, unspecified site: Secondary | ICD-10-CM | POA: Diagnosis not present

## 2021-12-06 DIAGNOSIS — S93402A Sprain of unspecified ligament of left ankle, initial encounter: Secondary | ICD-10-CM | POA: Diagnosis not present

## 2022-02-18 DIAGNOSIS — C8400 Mycosis fungoides, unspecified site: Secondary | ICD-10-CM | POA: Diagnosis not present

## 2022-05-14 DIAGNOSIS — C8400 Mycosis fungoides, unspecified site: Secondary | ICD-10-CM | POA: Diagnosis not present

## 2022-05-21 DIAGNOSIS — C8400 Mycosis fungoides, unspecified site: Secondary | ICD-10-CM | POA: Diagnosis not present

## 2022-05-21 DIAGNOSIS — D1801 Hemangioma of skin and subcutaneous tissue: Secondary | ICD-10-CM | POA: Diagnosis not present

## 2022-07-25 DIAGNOSIS — J0100 Acute maxillary sinusitis, unspecified: Secondary | ICD-10-CM | POA: Diagnosis not present

## 2022-07-25 DIAGNOSIS — R059 Cough, unspecified: Secondary | ICD-10-CM | POA: Diagnosis not present

## 2022-07-25 DIAGNOSIS — Z20822 Contact with and (suspected) exposure to covid-19: Secondary | ICD-10-CM | POA: Diagnosis not present

## 2022-08-07 DIAGNOSIS — C8400 Mycosis fungoides, unspecified site: Secondary | ICD-10-CM | POA: Diagnosis not present

## 2022-10-22 DIAGNOSIS — Z79899 Other long term (current) drug therapy: Secondary | ICD-10-CM | POA: Diagnosis not present

## 2022-10-22 DIAGNOSIS — C84A0 Cutaneous T-cell lymphoma, unspecified, unspecified site: Secondary | ICD-10-CM | POA: Diagnosis not present

## 2022-10-22 DIAGNOSIS — Z872 Personal history of diseases of the skin and subcutaneous tissue: Secondary | ICD-10-CM | POA: Diagnosis not present

## 2022-10-22 DIAGNOSIS — C8400 Mycosis fungoides, unspecified site: Secondary | ICD-10-CM | POA: Diagnosis not present

## 2022-10-31 ENCOUNTER — Ambulatory Visit: Payer: Medicare Other | Admitting: Dermatology

## 2023-01-07 DIAGNOSIS — Z79899 Other long term (current) drug therapy: Secondary | ICD-10-CM | POA: Diagnosis not present

## 2023-01-08 ENCOUNTER — Ambulatory Visit: Payer: Medicare Other | Admitting: Dermatology

## 2023-03-05 ENCOUNTER — Ambulatory Visit: Payer: Medicare Other | Admitting: Dermatology

## 2023-03-05 VITALS — BP 109/70 | HR 69

## 2023-03-05 DIAGNOSIS — L814 Other melanin hyperpigmentation: Secondary | ICD-10-CM

## 2023-03-05 DIAGNOSIS — Z7189 Other specified counseling: Secondary | ICD-10-CM

## 2023-03-05 DIAGNOSIS — L820 Inflamed seborrheic keratosis: Secondary | ICD-10-CM

## 2023-03-05 DIAGNOSIS — D1801 Hemangioma of skin and subcutaneous tissue: Secondary | ICD-10-CM

## 2023-03-05 DIAGNOSIS — Z1283 Encounter for screening for malignant neoplasm of skin: Secondary | ICD-10-CM | POA: Diagnosis not present

## 2023-03-05 DIAGNOSIS — L821 Other seborrheic keratosis: Secondary | ICD-10-CM | POA: Diagnosis not present

## 2023-03-05 DIAGNOSIS — W908XXA Exposure to other nonionizing radiation, initial encounter: Secondary | ICD-10-CM

## 2023-03-05 DIAGNOSIS — X32XXXA Exposure to sunlight, initial encounter: Secondary | ICD-10-CM

## 2023-03-05 DIAGNOSIS — D229 Melanocytic nevi, unspecified: Secondary | ICD-10-CM

## 2023-03-05 DIAGNOSIS — C84A0 Cutaneous T-cell lymphoma, unspecified, unspecified site: Secondary | ICD-10-CM

## 2023-03-05 DIAGNOSIS — D171 Benign lipomatous neoplasm of skin and subcutaneous tissue of trunk: Secondary | ICD-10-CM

## 2023-03-05 DIAGNOSIS — L578 Other skin changes due to chronic exposure to nonionizing radiation: Secondary | ICD-10-CM

## 2023-03-05 NOTE — Progress Notes (Signed)
Follow-Up Visit   Subjective  Jeanne Lewis is a 72 y.o. female who presents for the following: Skin Cancer Screening and Upper Body Skin Exam, hx of CTCL being followed by Dr Corky Downs  The patient presents for Upper Body Skin Exam (UBSE) for skin cancer screening and mole check. The patient has spots, moles and lesions to be evaluated, some may be new or changing and the patient has concerns that these could be cancer.  The following portions of the chart were reviewed this encounter and updated as appropriate: medications, allergies, medical history  Review of Systems:  No other skin or systemic complaints except as noted in HPI or Assessment and Plan.  Objective  Well appearing patient in no apparent distress; mood and affect are within normal limits. All skin waist up examined. Relevant physical exam findings are noted in the Assessment and Plan.  left posterior shoulder x 1, left breast x 1, right breat x 2 (4) Stuck-on, waxy, tan-brown papules--Discussed benign etiology and prognosis.    Assessment & Plan   Inflamed seborrheic keratosis (4) left posterior shoulder x 1, left breast x 1, right breat x 2  Symptomatic, irritating, patient would like treated.   Destruction of lesion - left posterior shoulder x 1, left breast x 1, right breat x 2 Complexity: simple   Destruction method: cryotherapy   Informed consent: discussed and consent obtained   Timeout:  patient name, date of birth, surgical site, and procedure verified Lesion destroyed using liquid nitrogen: Yes   Region frozen until ice ball extended beyond lesion: Yes   Outcome: patient tolerated procedure well with no complications   Post-procedure details: wound care instructions given    Lentigines, Seborrheic Keratoses, Hemangiomas - Benign normal skin lesions - Benign-appearing - Call for any changes  Melanocytic Nevi - Tan-brown and/or pink-flesh-colored symmetric macules and papules - Benign appearing on exam  today - Observation - Call clinic for new or changing moles - Recommend daily use of broad spectrum spf 30+ sunscreen to sun-exposed areas.   Actinic Damage - Chronic condition, secondary to cumulative UV/sun exposure - diffuse scaly erythematous macules with underlying dyspigmentation - Recommend daily broad spectrum sunscreen SPF 30+ to sun-exposed areas, reapply every 2 hours as needed.  - Staying in the shade or wearing long sleeves, sun glasses (UVA+UVB protection) and wide brim hats (4-inch brim around the entire circumference of the hat) are also recommended for sun protection.  - Call for new or changing lesions.  Lipoma  Exam: Subcutaneous rubbery nodule(s) Location: chest  Benign-appearing. Exam most consistent with a lipoma. Discussed that a lipoma is a benign fatty growth that can grow over time and sometimes get irritated. Recommend observation if it is not bothersome or changing. Discussed option of ILK injections or surgical excision to remove it if it is growing, symptomatic, or other changes noted. Please call for new or changing lesions so they can be evaluated.   Rash = CTCL torso, arms, legs CTCL.  Mycosis Fungoides. Persistent.  Pt had been non-compliant with follow ups with dermatology in the past. 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   Pt lost to follow up at [facility] from 2003 until 2020 and pt returned again in 2021.  She had been seen at [facility] dermatology as recently as 2009. Patient sent for reevaluation with Dr. Constance Goltz who she saw recently and Dr. Constance Goltz did new biopsy October  2022 showing CTCL and Dr Corky Downs put patient on Peginterferon alfa-2a (PEGASYS) 180 mcg/mL injection  Continue current treatment regimen from Dr. Corky Downs.  Last visit with Dr Corky Downs was 10/2022  Skin cancer screening performed today.  Return in about 1 year (around 03/04/2024) for TBSE.  ISKs .  IAngelique Holm, CMA, am acting as scribe for Armida Sans, MD .   Documentation: I have reviewed the above documentation for accuracy and completeness, and I agree with the above.  Armida Sans, MD

## 2023-03-05 NOTE — Patient Instructions (Addendum)
Cryotherapy Aftercare  Wash gently with soap and water everyday.   Apply Vaseline and Band-Aid daily until healed.     Due to recent changes in healthcare laws, you may see results of your pathology and/or laboratory studies on MyChart before the doctors have had a chance to review them. We understand that in some cases there may be results that are confusing or concerning to you. Please understand that not all results are received at the same time and often the doctors may need to interpret multiple results in order to provide you with the best plan of care or course of treatment. Therefore, we ask that you please give us 2 business days to thoroughly review all your results before contacting the office for clarification. Should we see a critical lab result, you will be contacted sooner.   If You Need Anything After Your Visit  If you have any questions or concerns for your doctor, please call our main line at [phone] and press option 4 to reach your doctor's medical assistant. If no one answers, please leave a voicemail as directed and we will return your call as soon as possible. Messages left after 4 pm will be answered the following business day.   You may also send us a message via MyChart. We typically respond to MyChart messages within 1-2 business days.  For prescription refills, please ask your pharmacy to contact our office. Our fax number is [phone].  If you have an urgent issue when the clinic is closed that cannot wait until the next business day, you can page your doctor at the number below.    Please note that while we do our best to be available for urgent issues outside of office hours, we are not available 24/7.   If you have an urgent issue and are unable to reach us, you may choose to seek medical care at your doctor's office, retail clinic, urgent care center, or emergency room.  If you have a medical emergency, please immediately call 911 or go to the  emergency department.  Pager Numbers  - Dr. Kowalski: [phone]  - Dr. Moye: [phone]  - Dr. Stewart: [phone]  In the event of inclement weather, please call our main line at [phone] for an update on the status of any delays or closures.  Dermatology Medication Tips: Please keep the boxes that topical medications come in in order to help keep track of the instructions about where and how to use these. Pharmacies typically print the medication instructions only on the boxes and not directly on the medication tubes.   If your medication is too expensive, please contact our office at [phone] option 4 or send us a message through MyChart.   We are unable to tell what your co-pay for medications will be in advance as this is different depending on your insurance coverage. However, we may be able to find a substitute medication at lower cost or fill out paperwork to get insurance to cover a needed medication.   If a prior authorization is required to get your medication covered by your insurance company, please allow us 1-2 business days to complete this process.  Drug prices often vary depending on where the prescription is filled and some pharmacies may offer cheaper prices.  The website www.goodrx.com contains coupons for medications through different pharmacies. The prices here do not account for what the cost may be with help from insurance (it may be cheaper with your insurance), but the website can   give you the price if you did not use any insurance.  - You can print the associated coupon and take it with your prescription to the pharmacy.  - You may also stop by our office during regular business hours and pick up a GoodRx coupon card.  - If you need your prescription sent electronically to a different pharmacy, notify our office through [place] MyChart or by phone at [phone] option 4.     Si Usted Necesita Algo Despus de Su Visita  Tambin puede  enviarnos un mensaje a travs de MyChart. Por lo general respondemos a los mensajes de MyChart en el transcurso de 1 a 2 das hbiles.  Para renovar recetas, por favor pida a su farmacia que se ponga en contacto con nuestra oficina. Nuestro nmero de fax es el [phone].  Si tiene un asunto urgente cuando la clnica est cerrada y que no puede esperar hasta el siguiente da hbil, puede llamar/localizar a su doctor(a) al nmero que aparece a continuacin.   Por favor, tenga en cuenta que aunque hacemos todo lo posible para estar disponibles para asuntos urgentes fuera del horario de oficina, no estamos disponibles las 24 horas del da, los 7 das de la semana.   Si tiene un problema urgente y no puede comunicarse con nosotros, puede optar por buscar atencin mdica  en el consultorio de su doctor(a), en una clnica privada, en un centro de atencin urgente o en una sala de emergencias.  Si tiene una emergencia mdica, por favor llame inmediatamente al 911 o vaya a la sala de emergencias.  Nmeros de bper  - Dr. Kowalski: [phone]  - Dra. Moye: [phone]  - Dra. Stewart: [phone]  En caso de inclemencias del tiempo, por favor llame a nuestra lnea principal al [phone] para una actualizacin sobre el [place] de cualquier retraso o cierre.  Consejos para la medicacin en dermatologa: Por favor, guarde las cajas en las que vienen los medicamentos de uso tpico para ayudarle a seguir las instrucciones sobre dnde y cmo usarlos. Las farmacias generalmente imprimen las instrucciones del medicamento slo en las cajas y no directamente en los tubos del medicamento.   Si su medicamento es muy caro, por favor, pngase en contacto con nuestra oficina llamando al [phone] y presione la opcin 4 o envenos un mensaje a travs de MyChart.   No podemos decirle cul ser su copago por los medicamentos por adelantado ya que esto es diferente dependiendo de la cobertura de su seguro.  Sin embargo, es posible que podamos encontrar un medicamento sustituto a menor costo o llenar un formulario para que el seguro cubra el medicamento que se considera necesario.   Si se requiere una autorizacin previa para que su compaa de seguros cubra su medicamento, por favor permtanos de 1 a 2 das hbiles para completar este proceso.  Los precios de los medicamentos varan con frecuencia dependiendo del lugar de dnde se surte la receta y alguna farmacias pueden ofrecer precios ms baratos.  El sitio web www.goodrx.com tiene cupones para medicamentos de diferentes farmacias. Los precios aqu no tienen en cuenta lo que podra costar con la ayuda del seguro (puede ser ms barato con su seguro), pero el sitio web puede darle el precio si no utiliz ningn seguro.  - Puede imprimir el cupn correspondiente y llevarlo con su receta a la farmacia.  - Tambin puede pasar por nuestra oficina durante el horario de atencin regular y recoger una tarjeta de cupones de GoodRx.  -   Si necesita que su receta se enve electrnicamente a una farmacia diferente, informe a nuestra oficina a travs de MyChart de [place] o por telfono llamando al [phone] y presione la opcin 4.  

## 2023-03-19 ENCOUNTER — Encounter: Payer: Self-pay | Admitting: Dermatology

## 2023-03-26 DIAGNOSIS — C8400 Mycosis fungoides, unspecified site: Secondary | ICD-10-CM | POA: Diagnosis not present

## 2023-03-26 DIAGNOSIS — Z79899 Other long term (current) drug therapy: Secondary | ICD-10-CM | POA: Diagnosis not present

## 2023-04-01 DIAGNOSIS — K080 Exfoliation of teeth due to systemic causes: Secondary | ICD-10-CM | POA: Diagnosis not present

## 2023-04-08 ENCOUNTER — Other Ambulatory Visit: Payer: Self-pay | Admitting: Nurse Practitioner

## 2023-04-08 DIAGNOSIS — Z1231 Encounter for screening mammogram for malignant neoplasm of breast: Secondary | ICD-10-CM

## 2023-04-08 DIAGNOSIS — K080 Exfoliation of teeth due to systemic causes: Secondary | ICD-10-CM | POA: Diagnosis not present

## 2023-04-28 DIAGNOSIS — K080 Exfoliation of teeth due to systemic causes: Secondary | ICD-10-CM | POA: Diagnosis not present

## 2023-07-03 DIAGNOSIS — Z79899 Other long term (current) drug therapy: Secondary | ICD-10-CM | POA: Diagnosis not present

## 2023-07-22 DIAGNOSIS — C8400 Mycosis fungoides, unspecified site: Secondary | ICD-10-CM | POA: Diagnosis not present

## 2023-07-22 DIAGNOSIS — Z79899 Other long term (current) drug therapy: Secondary | ICD-10-CM | POA: Diagnosis not present

## 2023-07-28 DIAGNOSIS — K200 Eosinophilic esophagitis: Secondary | ICD-10-CM | POA: Diagnosis not present

## 2023-07-28 DIAGNOSIS — K2270 Barrett's esophagus without dysplasia: Secondary | ICD-10-CM | POA: Diagnosis not present

## 2023-07-28 DIAGNOSIS — R1031 Right lower quadrant pain: Secondary | ICD-10-CM | POA: Diagnosis not present

## 2023-07-28 DIAGNOSIS — R1084 Generalized abdominal pain: Secondary | ICD-10-CM | POA: Diagnosis not present

## 2023-08-05 DIAGNOSIS — K080 Exfoliation of teeth due to systemic causes: Secondary | ICD-10-CM | POA: Diagnosis not present

## 2023-08-06 ENCOUNTER — Other Ambulatory Visit: Payer: Self-pay | Admitting: General Surgery

## 2023-08-06 DIAGNOSIS — R1031 Right lower quadrant pain: Secondary | ICD-10-CM

## 2023-08-13 ENCOUNTER — Ambulatory Visit
Admission: RE | Admit: 2023-08-13 | Discharge: 2023-08-13 | Disposition: A | Discharge status: Home / Self Care | Payer: Medicare Other | Source: Ambulatory Visit | Attending: General Surgery | Admitting: General Surgery

## 2023-08-13 DIAGNOSIS — R1031 Right lower quadrant pain: Secondary | ICD-10-CM | POA: Diagnosis not present

## 2023-08-13 DIAGNOSIS — K7689 Other specified diseases of liver: Secondary | ICD-10-CM | POA: Diagnosis not present

## 2023-08-13 DIAGNOSIS — R109 Unspecified abdominal pain: Secondary | ICD-10-CM | POA: Diagnosis not present

## 2023-09-02 DIAGNOSIS — K080 Exfoliation of teeth due to systemic causes: Secondary | ICD-10-CM | POA: Diagnosis not present

## 2023-10-01 DIAGNOSIS — C8400 Mycosis fungoides, unspecified site: Secondary | ICD-10-CM | POA: Diagnosis not present

## 2023-10-01 DIAGNOSIS — Z79899 Other long term (current) drug therapy: Secondary | ICD-10-CM | POA: Diagnosis not present

## 2023-11-04 ENCOUNTER — Encounter: Payer: Self-pay | Admitting: Physician Assistant

## 2023-11-04 ENCOUNTER — Ambulatory Visit (INDEPENDENT_AMBULATORY_CARE_PROVIDER_SITE_OTHER): Payer: Medicare Other | Admitting: Physician Assistant

## 2023-11-04 VITALS — BP 130/78 | HR 75 | Ht 62.0 in | Wt 120.0 lb

## 2023-11-04 DIAGNOSIS — R5383 Other fatigue: Secondary | ICD-10-CM

## 2023-11-04 DIAGNOSIS — J302 Other seasonal allergic rhinitis: Secondary | ICD-10-CM

## 2023-11-04 DIAGNOSIS — Z7689 Persons encountering health services in other specified circumstances: Secondary | ICD-10-CM | POA: Diagnosis not present

## 2023-11-04 DIAGNOSIS — M1990 Unspecified osteoarthritis, unspecified site: Secondary | ICD-10-CM

## 2023-11-04 DIAGNOSIS — C8442 Peripheral T-cell lymphoma, not classified, intrathoracic lymph nodes: Secondary | ICD-10-CM | POA: Diagnosis not present

## 2023-11-04 DIAGNOSIS — E7849 Other hyperlipidemia: Secondary | ICD-10-CM

## 2023-11-04 DIAGNOSIS — H2589 Other age-related cataract: Secondary | ICD-10-CM

## 2023-11-04 DIAGNOSIS — N898 Other specified noninflammatory disorders of vagina: Secondary | ICD-10-CM

## 2023-11-04 DIAGNOSIS — Z1211 Encounter for screening for malignant neoplasm of colon: Secondary | ICD-10-CM

## 2023-11-04 NOTE — Progress Notes (Signed)
 New patient visit  Patient: Jeanne Lewis   DOB: 1951-02-17   73 y.o. Female  MRN: [id number] Visit Date: 11/04/2023  Today's healthcare provider: Jolynn Spencer, PA-C   Chief Complaint  Patient presents with   New Patient (Initial Visit)    Physical and lab   Subjective    Jeanne Lewis is a 73 y.o. female who presents today as a new patient to establish care.   Discussed the use of AI scribe software for clinical note transcription with the patient, who gave verbal consent to proceed.  History of Present Illness   The patient, with a history of seasonal allergies, arthritis, cataracts, and cutaneous T-cell lymphoma, presents for a routine check-up. The patient reports well-controlled seasonal allergies, primarily in the spring and fall, suspected to be triggered by tree pollen and dust. The patient also suffers from arthritis, particularly in the knees and hips, and bursitis. 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 The patient is also under the care of an ophthalmologist for cataracts in both eyes, with plans for surgery in the near future.  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 The patient reports some improvement in symptoms, with lessening of dark spots and itchiness, although redness persists. The patient also mentions fatigue, which is attributed to the Pegasys treatment.  The patient also reports gastrointestinal issues, with a history of constipation shifting to diarrhea after receiving the COVID-19 vaccine. The patient has identified certain foods, including apples, eggs, and milk, that exacerbate the gastrointestinal discomfort. The patient is currently under the care of a gastroenterologist and is scheduled for an endoscopy.  The patient also reports ear problems, with a sensation of fluid in the ears, leading to popping sounds and  occasional dizziness. The patient has been using loratadine for allergies and has stopped using Q-tips for ear cleaning due to the associated discomfort.        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   Past Surgical History:  Procedure Laterality Date   WISDOM TOOTH EXTRACTION     Family Status  Relation Name Status   Mother  (Not Specified)   Mat Aunt  (Not Specified)   Bruna Brigham  (Not Specified)  No partnership data on file   Family History  Problem Relation Age of Onset   Macular degeneration Mother    Breast cancer Maternal Aunt 39   Cancer Paternal Uncle    Social History   Socioeconomic History   Marital status: Widowed    Spouse name: Not on file   Number of children: Not on file   Years of education: Not on file   Highest education level: Not on file  Occupational History   Not on file  Tobacco Use   Smoking status: Never   Smokeless tobacco: Never  Substance and Sexual Activity   Alcohol use: No   Drug use: No   Sexual activity: Not on file  Other Topics Concern   Not on file  Social History Narrative   Not on file   Social Drivers of Health   Financial Resource Strain: Not on file  Food Insecurity: Not on file  Transportation Needs: Not on file  Physical Activity: Not on file  Stress: Not on file  Social Connections: Not on file  Outpatient Medications Prior to Visit  Medication Sig   acetaminophen  (TYLENOL ) 325 MG tablet Take 650 mg by mouth every 4 (four) hours as needed. Reported on 04/10/2016   Ascorbic Acid (VITAMIN C) 1000 MG tablet Take 1,000 mg by mouth daily.   aspirin 81 MG tablet Take 81 mg by mouth daily. Reported on 04/10/2016   Cholecalciferol (VITAMIN D-3) 1000 UNITS CAPS Take 1 capsule by mouth daily at 6 (six) AM. Reported on 04/10/2016   fluticasone (FLONASE) 50 MCG/ACT nasal spray    loratadine (CLARITIN) 10 MG tablet     loratadine-pseudoephedrine (CLARITIN-D 24 HOUR) 10-240 MG 24 hr tablet every other day   NON FORMULARY Take 1 capsule by mouth daily. Tumeric   PEGASYS 180 MCG/ML injection INJECT 90MCG (0.5ML)  SUBCUTANEOUSLY WEEKLY (DISCARD  UNUSED MEDICATION EACH VIAL)   No facility-administered medications prior to visit.   Allergies  Allergen Reactions   Morphine And Codeine Other (See Comments)    Blurred vision   Neosporin [Neomycin-Bacitracin Zn-Polymyx] Itching   Polysporin [Bacitracin-Polymyxin B] Itching    Immunization History  Administered Date(s) Administered   Moderna Covid-19 Fall Seasonal Vaccine 12yrs & older 05/02/2020    Health Maintenance  Topic Date Due   Medicare Annual Wellness (AWV)  Never done   Pneumonia Vaccine 35+ Years old (1 of 2 - PCV) Never done   Hepatitis C Screening  Never done   DTaP/Tdap/Td (1 - Tdap) Never done   Zoster Vaccines- Shingrix (1 of 2) Never done   Colonoscopy  Never done   DEXA SCAN  Never done   COVID-19 Vaccine (2 - Moderna risk series) 05/30/2020   MAMMOGRAM  05/18/2022   INFLUENZA VACCINE  01/19/2024 (Originally 05/22/2023)   HPV VACCINES  Aged Out    Patient Care Team: Lun Muro, PA-C as PCP - General (Physician Assistant)  Review of Systems  All other systems reviewed and are negative.  Except see HPI       Objective    BP 130/78 (BP Location: Right Arm, Patient Position: Sitting, Cuff Size: Normal)   Pulse 75   Ht 5' 2 (1.575 m)   Wt 120 lb (54.4 kg)   SpO2 97%   BMI 21.95 kg/m     Physical Exam Vitals reviewed.  Constitutional:      General: She is not in acute distress.    Appearance: Normal appearance. She is well-developed. She is not diaphoretic.  HENT:     Head: Normocephalic and atraumatic.     Right Ear: Ear canal and external ear normal.     Left Ear: Ear canal and external ear normal.     Ears:     Comments: Fluids behind the tm    Nose: Congestion and rhinorrhea present.  Eyes:     General:  No scleral icterus.    Extraocular Movements: Extraocular movements intact.     Conjunctiva/sclera: Conjunctivae normal.     Pupils: Pupils are equal, round, and reactive to light.  Neck:     Thyroid: No thyromegaly.  Cardiovascular:     Rate and Rhythm: Normal rate and regular rhythm.     Pulses: Normal pulses.     Heart sounds: Normal heart sounds. No murmur heard. Pulmonary:     Effort: Pulmonary effort is normal. No respiratory distress.     Breath sounds: Normal breath sounds. No wheezing, rhonchi or rales.  Musculoskeletal:     Cervical back: Neck supple.     Right lower leg: No edema.  Left lower leg: No edema.  Lymphadenopathy:     Cervical: No cervical adenopathy.  Skin:    General: Skin is warm and dry.     Findings: No rash.  Neurological:     Mental Status: She is alert and oriented to person, place, and time. Mental status is at baseline.  Psychiatric:        Mood and Affect: Mood normal.        Behavior: Behavior normal.     Depression Screen    11/04/2023    3:32 PM 11/04/2023    3:26 PM  PHQ 2/9 Scores  PHQ - 2 Score 0 0  PHQ- 9 Score 2 2   No results found for any visits on 11/04/23.  Assessment & Plan         Cutaneous T-cell Lymphoma Other fatigue Redness and itching, dark spots fading. On Pegasys for almost 2 years after transition from Methotrexate. -Continue Pegasys. Needs updated labs cbc, cmp, lp, tsh Needs updated vaccination records Will follow-up  Cataracts Bilateral cataracts causing difficulty with night vision, planning for surgery this year. -Continue follow-up with ophthalmologist.  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 Recent ear congestion and popping. -Continue Loratadine. -Consider using Debrox for ear congestion. -Consider using nasal saline  and Flonase for nasal congestion.  Hyperlipidemia Elevated LDL cholesterol, not currently on medication. -Repeat lipid panel to confirm levels. -Consider starting lipid-lowering medication if levels remain elevated.  Gastrointestinal Issues Chronic diarrhea, dietary restrictions due to food intolerances. History of Eosinophilic Esophagitis (EOE). -Continue follow-up with gastroenterologist. -Consider Cologuard test.  Bloody vaginal discharge Recent episode Referral to obgyn was placed  General Health Maintenance -Consider bone density test given it has been over 10 years since last test. -Consider Hepatitis C screening as it is recommended once in a lifetime. -Consider mammogram, but patient to schedule. -Consider Pap smear, but given patient's age and history, may not be necessary. However, recent bloody discharge warrants consultation with OBGYN. Declined ua -Patient declined vaccinations for pneumonia, tetanus, and shingles. -Schedule annual wellness exam in one month. Records from the old primary pending    Encounter to establish care Welcomed to our clinic Reviewed past medical hx, social hx, family hx and surgical hx Pt advised to send all vaccination records or screening   Return in about 4 weeks (around 12/02/2023) for AW.    The patient was advised to call back or seek an in-person evaluation if the symptoms worsen or if the condition fails to improve as anticipated.  I discussed the assessment and treatment plan with the patient. The patient was provided an opportunity to ask questions and all were answered. The patient agreed with the plan and demonstrated an understanding of the instructions.  I, Nikitia Asbill, PA-C have reviewed all documentation for this visit. The documentation on  11/04/2023   for the exam, diagnosis, procedures, and orders are all accurate and complete.  Jolynn Spencer, [facility], MMS [facility] [phone] (phone) [phone]  (fax)  [facility]

## 2023-11-05 ENCOUNTER — Encounter: Payer: Self-pay | Admitting: Physician Assistant

## 2023-11-05 DIAGNOSIS — H269 Unspecified cataract: Secondary | ICD-10-CM | POA: Insufficient documentation

## 2023-11-05 DIAGNOSIS — R5383 Other fatigue: Secondary | ICD-10-CM | POA: Insufficient documentation

## 2023-11-05 DIAGNOSIS — M1990 Unspecified osteoarthritis, unspecified site: Secondary | ICD-10-CM | POA: Insufficient documentation

## 2023-11-05 DIAGNOSIS — N898 Other specified noninflammatory disorders of vagina: Secondary | ICD-10-CM | POA: Insufficient documentation

## 2023-11-05 DIAGNOSIS — J302 Other seasonal allergic rhinitis: Secondary | ICD-10-CM | POA: Insufficient documentation

## 2023-11-05 DIAGNOSIS — E7849 Other hyperlipidemia: Secondary | ICD-10-CM | POA: Insufficient documentation

## 2023-11-06 ENCOUNTER — Encounter: Payer: Self-pay | Admitting: Obstetrics

## 2023-11-14 ENCOUNTER — Ambulatory Visit: Payer: Medicare Other

## 2023-11-14 DIAGNOSIS — K2270 Barrett's esophagus without dysplasia: Secondary | ICD-10-CM | POA: Diagnosis not present

## 2023-11-14 DIAGNOSIS — K200 Eosinophilic esophagitis: Secondary | ICD-10-CM | POA: Diagnosis not present

## 2023-11-17 DIAGNOSIS — K080 Exfoliation of teeth due to systemic causes: Secondary | ICD-10-CM | POA: Diagnosis not present

## 2023-11-18 DIAGNOSIS — R5383 Other fatigue: Secondary | ICD-10-CM | POA: Diagnosis not present

## 2023-11-19 ENCOUNTER — Other Ambulatory Visit: Payer: Self-pay

## 2023-11-19 ENCOUNTER — Encounter: Payer: Self-pay | Admitting: Physician Assistant

## 2023-11-19 DIAGNOSIS — E782 Mixed hyperlipidemia: Secondary | ICD-10-CM

## 2023-11-19 LAB — CBC WITH DIFFERENTIAL/PLATELET
Basophils Absolute: 0 10*3/uL (ref 0.0–0.2)
Basos: 0 %
EOS (ABSOLUTE): 0 10*3/uL (ref 0.0–0.4)
Eos: 1 %
Hematocrit: 33.5 % — ABNORMAL LOW (ref 34.0–46.6)
Hemoglobin: 11.4 g/dL (ref 11.1–15.9)
Immature Grans (Abs): 0 10*3/uL (ref 0.0–0.1)
Immature Granulocytes: 1 %
Lymphocytes Absolute: 1.5 10*3/uL (ref 0.7–3.1)
Lymphs: 36 %
MCH: 31.3 pg (ref 26.6–33.0)
MCHC: 34 g/dL (ref 31.5–35.7)
MCV: 92 fL (ref 79–97)
Monocytes Absolute: 0.3 10*3/uL (ref 0.1–0.9)
Monocytes: 7 %
Neutrophils Absolute: 2.3 10*3/uL (ref 1.4–7.0)
Neutrophils: 55 %
Platelets: 204 10*3/uL (ref 150–450)
RBC: 3.64 x10E6/uL — ABNORMAL LOW (ref 3.77–5.28)
RDW: 13.3 % (ref 11.7–15.4)
WBC: 4.1 10*3/uL (ref 3.4–10.8)

## 2023-11-19 LAB — LIPID PANEL
Chol/HDL Ratio: 4.6 {ratio} — ABNORMAL HIGH (ref 0.0–4.4)
Cholesterol, Total: 229 mg/dL — ABNORMAL HIGH (ref 100–199)
HDL: 50 mg/dL (ref >39)
LDL Chol Calc (NIH): 127 mg/dL — ABNORMAL HIGH (ref 0–99)
Triglycerides: 294 mg/dL — ABNORMAL HIGH (ref 0–149)
VLDL Cholesterol Cal: 52 mg/dL — ABNORMAL HIGH (ref 5–40)

## 2023-11-19 LAB — COMPREHENSIVE METABOLIC PANEL
ALT: 21 [IU]/L (ref 0–32)
AST: 26 [IU]/L (ref 0–40)
Albumin: 4.3 g/dL (ref 3.8–4.8)
Alkaline Phosphatase: 91 [IU]/L (ref 44–121)
BUN/Creatinine Ratio: 39 — ABNORMAL HIGH (ref 12–28)
BUN: 19 mg/dL (ref 8–27)
Bilirubin Total: <0.2 mg/dL (ref 0.0–1.2)
CO2: 23 mmol/L (ref 20–29)
Calcium: 9.2 mg/dL (ref 8.7–10.3)
Chloride: 106 mmol/L (ref 96–106)
Creatinine, Ser: 0.49 mg/dL — ABNORMAL LOW (ref 0.57–1.00)
Globulin, Total: 2.1 g/dL (ref 1.5–4.5)
Glucose: 82 mg/dL (ref 70–99)
Potassium: 4.2 mmol/L (ref 3.5–5.2)
Sodium: 144 mmol/L (ref 134–144)
Total Protein: 6.4 g/dL (ref 6.0–8.5)
eGFR: 101 mL/min/{1.73_m2} (ref >59)

## 2023-11-19 MED ORDER — ROSUVASTATIN CALCIUM 5 MG PO TABS: 5.0000 mg | ORAL_TABLET | Freq: Every day | ORAL | 0 refills | Status: DC

## 2023-11-19 NOTE — Progress Notes (Signed)
Please, send rx if pt agrees and set up a follow-up

## 2023-11-19 NOTE — Progress Notes (Signed)
Rx for rosuvastatin 5mg  once at bedtime, 90# refill 0 Needs to follow-up in 4-6 weeks

## 2023-12-03 ENCOUNTER — Ambulatory Visit: Payer: Self-pay | Admitting: *Deleted

## 2023-12-03 ENCOUNTER — Encounter: Payer: Medicare Other | Admitting: Obstetrics

## 2023-12-03 NOTE — Telephone Encounter (Signed)
  Chief Complaint: "I've had the flu for 2 weeks now and I'm not getting any better".   She has not been tested for the flu.   "I just know I have it".   I'm so weak and not getting better. Symptoms: It started last Wed.  I'm having chills, coughing, congestion and really dizzy.   She fell and hurt herself.  (See notes).   She is talking slow and it's taking her longer to answer questions.   "My brain is not right".   She is still dizzy now and not improving. Frequency: Since last Wed but getting weaker and worse. Pertinent Negatives: Patient denies heart or lung issues. Disposition: [x] ED /[] Urgent Care (no appt availability in office) / [] Appointment(In office/virtual)/ []  [facility]/ [] Home Care/ [] Refused Recommended Disposition /[] [facility]/ []  Follow-up with PCP Additional Notes: I have referred her to the ED due to the severe weakness and dizziness.   She's going to see if one of her kids can take her or someone from her church.    She takes care of her mother and needs to have someone with her.    I let her know to call [phone] if she needed to but that she needed to get medical care.   She could possibly have pneumonia or something other than the flu since she has not been evaluated since becoming sick.    She was agreeable to going but sounded hesitant.    Message sent to [employer], PA-C

## 2023-12-03 NOTE — Telephone Encounter (Signed)
Reason for Disposition  Patient sounds very sick or weak to the triager  Answer Assessment - Initial Assessment Questions 1. WORST SYMPTOM: "What is your worst symptom?" (e.g., cough, runny nose, muscle aches, headache, sore throat, fever)      She has the flu for 2 weeks and she is still not feeling better.    It started last Wed.   I was taking the Coricidin but I ran out.      2. ONSET: "When did your flu symptoms start?"      I'm having chills, coughing, congestion, dizzy, I fell and hit my head on the wall and hurt my chest and rib and my toes and arm.   That happened Thur.  My arm is bruised.  They can't do anything for my rib.   My head is alright.   That fall was from the dizziness.    That made me feel worse.    I don't know if I have fever or not.   I'm having chills.    I have a runny nose.  Minor sore throat now.    3. COUGH: "How bad is the cough?"       I'm coughing up occasional mucus.   I have a hard time getting it up.  4. RESPIRATORY DISTRESS: "Describe your breathing."      Only where I hit my rib.   When I fell it popped and when I coughed the other day it popped.   I fell on something from being dizzy.   It's my left side.  I'm sure there is an injury there but I can't see it.   It's under my breast.  5. FEVER: "Do you have a fever?" If Yes, ask: "What is your temperature, how was it measured, and when did it start?"     I'm not sure. I'm drinking Ginger Al and some water.   I don't feel like it.   She is talking slow and c/o being dizzy today.    I have not felt like doing anything. I'm taking care of my mother.  I'm trying to get someone from Hospice to come over.   6. EXPOSURE: "Were you exposed to someone with influenza?"       It's the flu but I have not been tested.     7. FLU VACCINE: "Did you get a flu shot this year?"     Not asked   8. HIGH RISK DISEASE: "Do you have any chronic medical problems?" (e.g., heart or lung disease, asthma, weak immune system, or other  HIGH RISK conditions)     Not on blood thinners. Maybe one of my children can take me to the ED when I told her she needed to go.   Denies heart and lung problems.    9. PREGNANCY: "Is there any chance you are pregnant?" "When was your last menstrual period?"     N/A due to age 73. OTHER SYMPTOMS: "Do you have any other symptoms?"  (e.g., runny nose, muscle aches, headache, sore throat)       See above  Protocols used: Influenza (Flu) - [facility]

## 2023-12-04 ENCOUNTER — Telehealth: Payer: Medicare Other | Admitting: Family Medicine

## 2023-12-04 ENCOUNTER — Encounter: Payer: Self-pay | Admitting: Family Medicine

## 2023-12-04 DIAGNOSIS — J069 Acute upper respiratory infection, unspecified: Secondary | ICD-10-CM

## 2023-12-04 MED ORDER — BENZONATATE 100 MG PO CAPS: 100.0000 mg | ORAL_CAPSULE | Freq: Two times a day (BID) | ORAL | 0 refills | Status: DC | PRN

## 2023-12-04 NOTE — Progress Notes (Signed)
MyChart Video Visit    Virtual Visit via Video Note   This format is felt to be most appropriate for this patient at this time. Physical exam was limited by quality of the video and audio technology used for the visit.   Patient location: Patient's home address   Provider location: [facility]  [address]  [place], [place] [address]   I discussed the limitations of evaluation and management by telemedicine and the availability of in person appointments. The patient expressed understanding and agreed to proceed.  Patient: Jeanne Lewis   DOB: 12/13/1950   73 y.o. Female  MRN: [id number] Visit Date: 12/04/2023  Today's healthcare provider: Ronnald Ramp, MD   Chief Complaint  Patient presents with   Cough   Subjective    HPI   Discussed the use of AI scribe software for clinical note transcription with the patient, who gave verbal consent to proceed.  Initial visit used video visit but due to problems with audio, the last 2/3 of the visit was conducted via phone call   History of Present Illness   Jeanne Lewis is a 73 year old female with cutaneous T cell lymphoma who presents with a persistent cough.  She has been experiencing a persistent cough since last Wednesday, initially accompanied by rib pain due to a fall, which hindered her ability to expectorate. The cough is now improving, and she is able to cough up phlegm more effectively. She has not been able to take a COVID or flu test due to her inability to leave the house, and she notes that her symptoms often progress to a sinus infection after about three weeks.  Earlier in the illness, she experienced fever and chills but has not taken her temperature today. No current shortness of breath, although she had some difficulty breathing after her fall.  She suffered a fall resulting in a head injury and rib pain, which initially prevented her from coughing up phlegm effectively. She  describes feeling better and more mobile compared to earlier in the illness when she was largely bedridden.  She has not been taking her prescribed Flonase and Claritin recently due to difficulty swallowing pills. She has used Imodium to manage diarrhea, although she is reluctant to take it.  She is allergic to Augmentin and has a history of cutaneous T cell lymphoma. She has been using a 'breathe stick' on her neck to help with phlegm and plans to try honey as recommended by her sister-in-law.  She is responsible for taking care of her mother.          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    Medications: Outpatient Medications Prior to Visit  Medication Sig   Acetaminophen 500 MG capsule Take 500 mg by mouth every 4 (four) hours as needed. Patient takes 2 (500mg  Caplets) in the morning and 2  (500 mg caplets) in the PM.   Ascorbic Acid (VITAMIN C) 1000 MG tablet Take 1,000 mg by mouth daily.   aspirin 81 MG tablet Take 81 mg by mouth daily as needed. Reported on 04/10/2016   Cholecalciferol (VITAMIN D-3) 1000 UNITS CAPS Take 1 capsule by mouth daily at 6 (six) AM. Reported on 04/10/2016   fluticasone (FLONASE) 50 MCG/ACT nasal spray    loratadine (CLARITIN) 10 MG tablet    loratadine-pseudoephedrine (CLARITIN-D 24 HOUR) 10-240  MG 24 hr tablet every other day   NON FORMULARY Take 1 capsule by mouth daily. Tumeric   PEGASYS 180 MCG/ML injection INJECT (0.5ML)  SUBCUTANEOUSLY WEEKLY (DISCARD  UNUSED MEDICATION EACH VIAL)   rosuvastatin (CRESTOR) 5 MG tablet Take 1 tablet (5 mg total) by mouth at bedtime. (Patient not taking: Reported on 12/04/2023)   No facility-administered medications prior to visit.    Review of Systems      Objective    There were no vitals taken for this visit.  BP Readings from Last 3 Encounters:  11/04/23 130/78  03/05/23 109/70  04/17/18 112/70   Wt Readings  from Last 3 Encounters:  11/04/23 120 lb (54.4 kg)  04/17/18 150 lb (68 kg)  12/29/17 149 lb 6.4 oz (67.8 kg)        Physical Exam Constitutional:      General: She is not in acute distress.    Appearance: Normal appearance. She is not ill-appearing.  Pulmonary:     Effort: Pulmonary effort is normal. No respiratory distress.     Comments: Rare nonproductive sounding cough during call  Neurological:     Mental Status: She is alert and oriented to person, place, and time.        Assessment & Plan     Problem List Items Addressed This Visit   None Visit Diagnoses       Viral upper respiratory tract infection    -  Primary           Persistent Cough Persistent cough since last Wednesday, with fever, chills, rhinorrhea, diarrhea, and dizziness. Differential includes upper respiratory viral illness, COVID-19, influenza, and sinusitis. Symptoms improving, no COVID or flu test taken. History of sinus infections post-upper respiratory symptoms. Discussed antibiotics' ineffectiveness for viral illnesses and supportive care. Cough can last up to eight weeks post-infection. Discussed Tessalon Perles for cough relief and warm teas and honey as natural antitussives. Encouraged hydration to break up mucus. Advised resuming Flonase and Claritin for nasal congestion and postnasal drip. - Prescribe Tessalon Perles 100 mg twice daily as needed for cough - Recommend warm teas and honey for natural antitussive effects - Encourage hydration to help break up mucus - Advise resuming Flonase (2 sprays in each nostril once daily) and Claritin 10 mg daily - Schedule an in-office visit within 10-14 days for further evaluation  Cutaneous T-Cell Lymphoma Cutaneous T-cell lymphoma, no specific issues discussed during the visit.  General Health Maintenance Discussed the importance of taking Flonase and Claritin as prescribed to manage nasal congestion and postnasal drip. - Advise resuming Flonase  (2 sprays in each nostril once daily) and Claritin 10 mg daily.         Return in about 10 days (around 12/14/2023) for cough, URI.     I discussed the assessment and treatment plan with the patient. The patient was provided an opportunity to ask questions and all were answered. The patient agreed with the plan and demonstrated an understanding of the instructions.   The patient was advised to call back or seek an in-person evaluation if the symptoms worsen or if the condition fails to improve as anticipated.  I provided 17 minutes of non-face-to-face time during this encounter.   Ronnald Ramp, MD [facility] [phone] (phone) [phone] (fax)  [facility]

## 2023-12-04 NOTE — Telephone Encounter (Signed)
This encounter was created in error - please disregard.

## 2023-12-04 NOTE — Telephone Encounter (Signed)
Pt called back stated that she feels better. Stated that she is drinking Pedialyte and feels like she does not need to go to ED. Based on her statement and she sounds alert and does not sound weak, this NT changed disposition to a VV. No openings with PCP- made VV appt with Dr. Neita Garnet.

## 2023-12-04 NOTE — Patient Instructions (Signed)
VISIT SUMMARY:  During today's visit, we discussed your persistent cough, which has been ongoing since last Wednesday. You mentioned that the cough initially came with rib pain due to a fall, but it is now improving. We also reviewed your history of sinus infections and your current symptoms, including fever, chills, and difficulty breathing after the fall. Additionally, we talked about your cutaneous T-cell lymphoma and general health maintenance.  YOUR PLAN:  -PERSISTENT COUGH: A persistent cough can be caused by various factors, including viral infections, and can last up to eight weeks. We discussed that antibiotics are not effective for viral illnesses and focused on supportive care. You are prescribed Tessalon Perles 100 mg twice daily as needed for cough relief. Additionally, warm teas and honey can help soothe your throat, and staying hydrated will help break up mucus. Please resume taking Flonase (2 sprays in each nostril once daily) and Claritin 10 mg daily to manage nasal congestion and postnasal drip.  -GENERAL HEALTH MAINTENANCE: We discussed the importance of taking Flonase and Claritin as prescribed to manage nasal congestion and postnasal drip. Please resume taking Flonase (2 sprays in each nostril once daily) and Claritin 10 mg daily.  INSTRUCTIONS:  Please schedule an in-office visit within 10-14 days for further evaluation.

## 2023-12-18 NOTE — Progress Notes (Signed)
 GYNECOLOGY PROGRESS NOTE  Subjective:  PCP: Debera Lat, PA-C  Patient ID: Jeanne Lewis, female    DOB: 03-22-1951, 73 y.o.   MRN: [id number]  HPI  Patient is a 73 y.o. G25P3003 female who presents for bloody vaginal discharge 2-3 times in the past 4 months. She reports some yesterday. No symptoms with this discharge, no itching, burning, odor, and it self-resolves, never more than 1 day. No prior hx of PMB. Has not been sexually active for several years after her husband's passing. Does not place anything in the vaginal canal like pessaries, tampons, and doesn't douche. She is not taking blood-thinning medications. Denies hx of HRT. 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   GYN Hx: Menarche at 73yo, menopause at 73yo Pap: last done "several years ago" and perhaps had abnormal result in her 41's. 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   Past Surgical History:  Procedure Laterality Date   WISDOM TOOTH EXTRACTION     Family History  Problem Relation Age of Onset   Macular degeneration Mother    Breast cancer Maternal Aunt 63   Cancer Paternal Uncle    Social History   Socioeconomic History   Marital status: Widowed    Spouse name: Not on file   Number of children: Not on file   Years of education: Not on file   Highest education level: Not on file  Occupational History   Not on file  Tobacco Use   Smoking status: Never   Smokeless tobacco: Never  Vaping Use   Vaping status: Never Used  Substance and Sexual Activity   Alcohol use: No   Drug use: No   Sexual activity: Not Currently  Other Topics Concern   Not on file  Social History Narrative   Not on  file   Social Drivers of Health   Financial Resource Strain: Not on file  Food Insecurity: Not on file  Transportation Needs: Not on file  Physical Activity: Not on file  Stress: Not on file  Social Connections: Not on file  Intimate Partner Violence: Not on file   Current Outpatient Medications on File Prior to Visit  Medication Sig Dispense Refill   Acetaminophen 500 MG capsule Take 500 mg by mouth every 4 (four) hours as needed. Patient takes 2 (500mg  Caplets) in the morning and 2  (500 mg caplets) in the PM.     Ascorbic Acid (VITAMIN C) 1000 MG tablet Take 1,000 mg by mouth daily.     aspirin 81 MG tablet Take 81 mg by mouth daily as needed. Reported on 04/10/2016     benzonatate (TESSALON) 100 MG capsule Take 1 capsule (100 mg total) by mouth 2 (two) times daily as needed for cough. 20 capsule 0   Cholecalciferol (VITAMIN D-3) 1000 UNITS CAPS Take 1 capsule by mouth daily at 6 (  six) AM. Reported on 04/10/2016     fluticasone (FLONASE) 50 MCG/ACT nasal spray      loratadine (CLARITIN) 10 MG tablet      loratadine-pseudoephedrine (CLARITIN-D 24 HOUR) 10-240 MG 24 hr tablet every other day     NON FORMULARY Take 1 capsule by mouth daily. Tumeric     PEGASYS 180 MCG/ML injection INJECT (0.5ML)  SUBCUTANEOUSLY WEEKLY (DISCARD  UNUSED MEDICATION EACH VIAL)     rosuvastatin (CRESTOR) 5 MG tablet Take 1 tablet (5 mg total) by mouth at bedtime. 90 tablet 0   No current facility-administered medications on file prior to visit.   Allergies  Allergen Reactions   Augmentin [Amoxicillin-Pot Clavulanate] Itching and Swelling   Morphine And Codeine Other (See Comments)    Blurred vision   Neosporin [Neomycin-Bacitracin Zn-Polymyx] Itching   Polysporin [Bacitracin-Polymyxin B] Itching   Review of Systems Pertinent items are noted in HPI.   Objective:   Blood pressure 126/74, pulse 85, height 5' (1.524 m), weight 119 lb (54 kg). Body mass index is 23.24 kg/m.  Physical Exam Vitals  and nursing note reviewed. Exam conducted with a chaperone present.  Constitutional:      Appearance: Normal appearance.  HENT:     Head: Normocephalic and atraumatic.  Eyes:     Extraocular Movements: Extraocular movements intact.  Pulmonary:     Effort: Pulmonary effort is normal.  Abdominal:     General: Abdomen is flat.     Palpations: Abdomen is soft.  Genitourinary:    General: Normal vulva.     Urethra: Prolapse present.     Vagina: No vaginal discharge (atrophic mucosa), erythema, tenderness, bleeding or lesions.     Cervix: Normal. No cervical motion tenderness, friability, lesion or erythema.     Uterus: Normal. Not tender and no uterine prolapse.      Adnexa: Right adnexa normal and left adnexa normal.  Musculoskeletal:        General: Normal range of motion.     Cervical back: Normal range of motion.  Neurological:     General: No focal deficit present.     Mental Status: She is alert.    Assessment/Plan:   1. Bloody vaginal discharge   2. Postmenopausal bleeding   3. Cervical cancer screening     Pap, BV/yeast, and pelvic US ordered; will contact with results and next steps.     Julieanne Manson, DO [facility]

## 2023-12-22 ENCOUNTER — Other Ambulatory Visit (HOSPITAL_COMMUNITY)
Admission: RE | Admit: 2023-12-22 | Discharge: 2023-12-22 | Disposition: A | Discharge status: Home / Self Care | Source: Ambulatory Visit | Attending: Obstetrics | Admitting: Obstetrics

## 2023-12-22 ENCOUNTER — Ambulatory Visit: Payer: Medicare Other | Admitting: Obstetrics

## 2023-12-22 ENCOUNTER — Encounter: Payer: Self-pay | Admitting: Obstetrics

## 2023-12-22 VITALS — BP 126/74 | HR 85 | Ht 60.0 in | Wt 119.0 lb

## 2023-12-22 DIAGNOSIS — Z124 Encounter for screening for malignant neoplasm of cervix: Secondary | ICD-10-CM

## 2023-12-22 DIAGNOSIS — N950 Postmenopausal bleeding: Secondary | ICD-10-CM | POA: Insufficient documentation

## 2023-12-22 DIAGNOSIS — N898 Other specified noninflammatory disorders of vagina: Secondary | ICD-10-CM | POA: Diagnosis not present

## 2023-12-24 LAB — CERVICOVAGINAL ANCILLARY ONLY
Bacterial Vaginitis (gardnerella): NEGATIVE
Candida Glabrata: NEGATIVE
Candida Vaginitis: NEGATIVE
Comment: NEGATIVE
Comment: NEGATIVE
Comment: NEGATIVE

## 2023-12-25 ENCOUNTER — Encounter: Payer: Self-pay | Admitting: Obstetrics

## 2023-12-25 LAB — CYTOLOGY - PAP
Comment: NEGATIVE
Diagnosis: NEGATIVE
High risk HPV: NEGATIVE

## 2023-12-31 ENCOUNTER — Ambulatory Visit

## 2024-01-02 ENCOUNTER — Ambulatory Visit
Admission: RE | Admit: 2024-01-02 | Discharge: 2024-01-02 | Disposition: A | Discharge status: Home / Self Care | Source: Ambulatory Visit | Attending: Obstetrics | Admitting: Obstetrics

## 2024-01-02 DIAGNOSIS — Z780 Asymptomatic menopausal state: Secondary | ICD-10-CM | POA: Diagnosis not present

## 2024-01-02 DIAGNOSIS — N898 Other specified noninflammatory disorders of vagina: Secondary | ICD-10-CM | POA: Insufficient documentation

## 2024-01-02 DIAGNOSIS — N950 Postmenopausal bleeding: Secondary | ICD-10-CM | POA: Insufficient documentation

## 2024-01-08 ENCOUNTER — Encounter: Payer: Self-pay | Admitting: Emergency Medicine

## 2024-01-08 ENCOUNTER — Telehealth: Payer: Self-pay

## 2024-01-08 NOTE — Telephone Encounter (Signed)
 Copied from CRM [id number]. Topic: Clinical - Request for Lab/Test Order >> Jan 08, 2024  9:23 AM Franchot Heidelberg wrote: Reason for CRM: Pt says she needs to complete fasting labs that she missed during her recent visit, please advise when labs are ready  Best contact: [phone]

## 2024-01-13 ENCOUNTER — Encounter: Payer: Self-pay | Admitting: Obstetrics

## 2024-01-13 ENCOUNTER — Telehealth: Payer: Self-pay | Admitting: Obstetrics

## 2024-01-13 ENCOUNTER — Other Ambulatory Visit: Payer: Self-pay | Admitting: Obstetrics

## 2024-01-13 DIAGNOSIS — N3289 Other specified disorders of bladder: Secondary | ICD-10-CM

## 2024-01-13 NOTE — Progress Notes (Signed)
 Referral to urology for bladder mass

## 2024-01-13 NOTE — Telephone Encounter (Signed)
 Left voicemail that call was re: [place] results and would be sending MyChart message shortly. Call back with any questions, concerns.

## 2024-01-14 ENCOUNTER — Ambulatory Visit: Payer: Medicare Other

## 2024-01-14 ENCOUNTER — Ambulatory Visit: Admitting: Urology

## 2024-01-14 VITALS — BP 143/88 | HR 93 | Ht 60.0 in | Wt 120.0 lb

## 2024-01-14 DIAGNOSIS — N3289 Other specified disorders of bladder: Secondary | ICD-10-CM

## 2024-01-14 DIAGNOSIS — R350 Frequency of micturition: Secondary | ICD-10-CM | POA: Diagnosis not present

## 2024-01-14 DIAGNOSIS — N950 Postmenopausal bleeding: Secondary | ICD-10-CM

## 2024-01-14 LAB — URINALYSIS, COMPLETE
Bilirubin, UA: NEGATIVE
Glucose, UA: NEGATIVE
Nitrite, UA: NEGATIVE
Specific Gravity, UA: 1.025 (ref 1.005–1.030)
Urobilinogen, Ur: 0.2 mg/dL (ref 0.2–1.0)
pH, UA: 6 (ref 5.0–7.5)

## 2024-01-14 LAB — MICROSCOPIC EXAMINATION

## 2024-01-14 NOTE — Patient Instructions (Signed)

## 2024-01-14 NOTE — Progress Notes (Signed)
 Jeanne Lewis,acting as a scribe for Jeanne Scotland, MD.,have documented all relevant documentation on the behalf of Jeanne Scotland, MD,as directed by  Jeanne Scotland, MD while in the presence of Jeanne Scotland, MD.  01/14/24 2:54 PM   Jeanne Lewis May 30, 1951 [id number]  Referring provider: Julieanne Manson, MD [address] [place],  [place] [address]  Chief Complaint  Patient presents with   Follow-up    HPI: 73 y/o female who underwent a transvaginal ultrasound for further evaluation of post menopausal bleeding.   Incidentally, the ultrasound revealed a 9 mm nodular mass on the posterior bladder wall, raising concerns for possible urothelial carcinoma. She does not have any recent urinalyses.   She reports pelvic pain but is unsure if it is related to the mass or another issue. She was informed of the ultrasound findings two days ago and is uncertain about the significance of the mass. She denies any history of visible hematuria, kidney stones, or smoking. She has not had any surgeries or procedures on her bladder.   She reports increased urinary frequency, which she attributes to a possible habit. She is concerned about the findings and is seeking further evaluation.   She is currently dealing with personal stress as her mother is in the hospital and will soon be moved to a nursing home.   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 If you have any questions, ask your nurse or doctor.          Acetaminophen 500 MG capsule Take 500 mg by mouth every 4 (four) hours as needed. Patient takes 2 (500mg  Caplets) in the morning and 2  (500 mg caplets) in the PM.   aspirin 81 MG tablet Take 81 mg by mouth daily as needed. Reported on 04/10/2016   benzonatate 100 MG capsule Commonly known as: TESSALON Take 1 capsule (100 mg total) by mouth 2 (two) times daily as needed for cough.   Claritin-D 24 Hour 10-240 MG 24 hr tablet Generic drug: loratadine-pseudoephedrine every other day   fluticasone 50 MCG/ACT nasal spray Commonly known as: FLONASE   loratadine 10 MG tablet Commonly known as: CLARITIN   NON FORMULARY Take 1 capsule by mouth daily. Tumeric   Pegasys 180 MCG/ML injection Generic drug: peginterferon alfa-2a INJECT (0.5ML)  SUBCUTANEOUSLY WEEKLY (DISCARD  UNUSED MEDICATION EACH VIAL)   rosuvastatin 5 MG tablet Commonly known as: Crestor Take 1 tablet (5 mg total) by mouth at bedtime.   vitamin C 1000 MG tablet Take 1,000 mg by mouth daily.   Vitamin D-3 25 MCG (1000 UT) Caps Take 1 capsule by mouth daily at 6 (six) AM. Reported on 04/10/2016        Allergies:  Allergies  Allergen Reactions   Augmentin [Amoxicillin-Pot Clavulanate] Itching and Swelling   Morphine And Codeine Other (See Comments)  Blurred vision   Neosporin [Neomycin-Bacitracin Zn-Polymyx] Itching   Polysporin [Bacitracin-Polymyxin B] Itching    Family History: Family History  Problem Relation Age of Onset   Macular degeneration Mother    Breast cancer Maternal Aunt 36   Cancer Paternal Uncle     Social History:  reports that she has never smoked. She has never used smokeless tobacco. She reports that she does not drink alcohol and does not use drugs.   Physical Exam: BP (!) 143/88   Pulse 93   Ht 5' (1.524 m)   Wt 120 lb (54.4 kg)   BMI 23.44 kg/m   Constitutional:  Alert and oriented, No acute  distress. HEENT: [place] AT, moist mucus membranes.  Trachea midline, no masses. Neurologic: Grossly intact, no focal deficits, moving all 4 extremities. Psychiatric: Normal mood and affect.  Narrative & Impression  CLINICAL DATA:  Postmenopausal vaginal lead   EXAM: TRANSABDOMINAL AND TRANSVAGINAL ULTRASOUND OF PELVIS   DOPPLER ULTRASOUND OF OVARIES   TECHNIQUE: Both transabdominal and transvaginal ultrasound examinations of the pelvis were performed. Transabdominal technique was performed for global imaging of the pelvis including uterus, ovaries, adnexal regions, and pelvic cul-de-sac.   It was necessary to proceed with endovaginal exam following the transabdominal exam to visualize the uterus and adnexa. Color and duplex Doppler ultrasound was utilized to evaluate blood flow to the ovaries.   COMPARISON:  None Available.   FINDINGS: Uterus   Measurements: 5.1 x 3.1 x 4.6 cm = volume: 38 mL. No fibroids or other mass visualized.   Endometrium   Thickness: 2.4 mm. Echogenic mass in the endometrial cavity measures 1.9 x 1.0 x 1.5 cm. Polypoid lesion versus other endometrial masses. Biopsy is recommended.   Right ovary   Measurements: 2.4 x 2.1 x 1.6 cm = volume: 4.2 mL. Cyst of 1.8 x 1.5 x 1.8 cm   Left ovary   Measurements: Not visualized due to bowel gas = volume: mL. Normal appearance/no adnexal mass.   Pulsed Doppler evaluation of both ovaries demonstrates normal low-resistance arterial and venous waveforms.   Other findings   Incidentally a 7 x 7 x 9 mm echogenic nodular mass in the posterior wall of the bladder, the possibility of a transitional carcinoma should be excluded and correlation with cystoscopy recommended   IMPRESSION: *Echogenic mass in the endometrial cavity measures 1.9 x 1.0 x 1.5 cm. Polypoid lesion versus other endometrial masses. Biopsy is recommended. *Incidentally a 7 x 7 x 9 mm echogenic nodular mass in the posterior wall of the  bladder, the possibility of a transitional carcinoma should be excluded and correlation with cystoscopy recommended.     Electronically Signed   By: Shaaron Adler M.D.   On: 01/12/2024 14:45   Ultrasound personally reviewed  Assessment & Plan:    1. Possible Urothelial Carcinoma - The ultrasound finding of a nodular mass on the bladder wall is concerning for urothelial carcinoma.  - However, ultrasound findings can sometimes be misleading due to artifacts such as bladder folds or scar tissue. - A cystoscopy is planned to visually confirm the presence of the mass and assess the need for a biopsy.  - Schedule the cystoscopy as soon as possible due to her current availability and concern.  2. Postmenopausal Bleeding - She reports postmenopausal bleeding, which requires further evaluation. The source of bleeding is unclear, and she is unsure if it is related to the uterine polyp or another cause.  - Further gynecological evaluation may be necessary depending on  the findings of the cystoscopy and any additional symptoms.  3. Urinary Frequency - She reports increased urinary frequency, which may be related to the bladder mass or other urological issues.  - A urinalysis will be obtained to check for hematuria or abnormal cells, which could provide additional diagnostic information.  Return for cystoscopy.  I have reviewed the above documentation for accuracy and completeness, and I agree with the above.   Jeanne Scotland, MD   [facility] [address] [place], [place] [address] [phone]

## 2024-01-15 ENCOUNTER — Ambulatory Visit: Admitting: Urology

## 2024-01-15 VITALS — BP 154/86 | HR 79 | Ht 60.0 in | Wt 120.0 lb

## 2024-01-15 DIAGNOSIS — N950 Postmenopausal bleeding: Secondary | ICD-10-CM

## 2024-01-20 ENCOUNTER — Telehealth: Payer: Self-pay

## 2024-01-20 NOTE — Telephone Encounter (Signed)
 Copied from CRM [id number]. Topic: Appointments - Scheduling Inquiry for Clinic >> Jan 19, 2024  3:51 PM Franchot Heidelberg wrote: Reason for CRM: Pt called requesting for her PCP to order her biopsy, per recent visit. Says she needs this set up by her PCP to be covered by her insurance.

## 2024-01-26 ENCOUNTER — Telehealth: Payer: Self-pay

## 2024-01-26 NOTE — Telephone Encounter (Signed)
 Detailed voicemail left per DRP. Ok for E2C2 to advise if call is returned

## 2024-01-26 NOTE — Telephone Encounter (Addendum)
 Patient checking on the status of biopsy appointment and would like a follow up call.

## 2024-01-26 NOTE — Telephone Encounter (Signed)
 Pt left a message on triage, needing to schedule her biopsy. Is this supposed to be a endometrial bx?

## 2024-01-27 DIAGNOSIS — K080 Exfoliation of teeth due to systemic causes: Secondary | ICD-10-CM | POA: Diagnosis not present

## 2024-01-27 NOTE — Telephone Encounter (Signed)
 Yes, she is scheduled 4/16

## 2024-02-02 ENCOUNTER — Ambulatory Visit: Admitting: Obstetrics

## 2024-02-04 ENCOUNTER — Ambulatory Visit: Admitting: Obstetrics

## 2024-02-04 ENCOUNTER — Other Ambulatory Visit (HOSPITAL_COMMUNITY)
Admission: RE | Admit: 2024-02-04 | Discharge: 2024-02-04 | Disposition: A | Discharge status: Home / Self Care | Source: Ambulatory Visit | Attending: Obstetrics | Admitting: Obstetrics

## 2024-02-04 ENCOUNTER — Encounter: Payer: Self-pay | Admitting: Obstetrics

## 2024-02-04 VITALS — BP 118/59 | HR 82 | Ht 60.0 in | Wt 119.0 lb

## 2024-02-04 DIAGNOSIS — N950 Postmenopausal bleeding: Secondary | ICD-10-CM | POA: Insufficient documentation

## 2024-02-04 DIAGNOSIS — N9489 Other specified conditions associated with female genital organs and menstrual cycle: Secondary | ICD-10-CM | POA: Insufficient documentation

## 2024-02-04 DIAGNOSIS — N840 Polyp of corpus uteri: Secondary | ICD-10-CM | POA: Diagnosis not present

## 2024-02-04 DIAGNOSIS — N8110 Cystocele, unspecified: Secondary | ICD-10-CM | POA: Insufficient documentation

## 2024-02-04 NOTE — Progress Notes (Signed)
 GYNECOLOGY PROGRESS NOTE  Subjective:  PCP: Debera Lat, PA-C  Patient ID: Jeanne Lewis, female    DOB: 1951/07/14, 73 y.o.   MRN: [id number]  HPI  Patient is a 73 y.o. G54P3003 female who presents for endometrial biopsy for PMB. Was seen last 12/22/23 for PMB, wanted to defer EMB, US obtained for EMT and US showed EMT 3.93mm, with a echogenic mass in the endometrial cavity measures 1.9 x 1.0 x 1.5cm, Polypoid lesion versus other endometrial mass. Incidental bladder mass also seen, so pt was referred to urology first with concern for carcinoma. Pt saw urology 01/14/24, she reports she had a cystoscopy and was told there was nothing there to biopsy, [place] may have shown a shadow. Unable to verify this in the chart. Today she returns to [place] to for EMB. No further PMB.   She also reports urinary frequency and fullness in her vagina, wondering if she has a prolapse. Sometimes in the shower with washing, will feel a "bulge" but never anything external.   The following portions of the patient's history were reviewed and updated as appropriate: allergies, current medications, past family history, past medical history, past social history, past surgical history, and problem list.  Review of Systems Pertinent items are noted in HPI.   Objective:   Blood pressure (!) 118/59, pulse 82, height 5' (1.524 m), weight 119 lb (54 kg). Body mass index is 23.24 kg/m.  General appearance: alert and cooperative Abdomen: soft, non-tender; bowel sounds normal; no masses,  no organomegaly Pelvic: cervix normal in appearance, external genitalia normal, no adnexal masses or tenderness, no cervical motion tenderness, positive findings: cystocele, rectovaginal septum normal, uterus normal size, shape, and consistency, and vagina normal without discharge Extremities: extremities normal, atraumatic, no cyanosis or edema Neurologic: Grossly normal  01/02/24 US PELVIC FINDINGS: Uterus   Measurements: 5.1 x 3.1 x 4.6 cm =  volume: 38 mL. No fibroids or other mass visualized.   Endometrium   Thickness: 2.4 mm. Echogenic mass in the endometrial cavity measures 1.9 x 1.0 x 1.5 cm. Polypoid lesion versus other endometrial masses. Biopsy is recommended.   Right ovary   Measurements: 2.4 x 2.1 x 1.6 cm = volume: 4.2 mL. Cyst of 1.8 x 1.5 x 1.8 cm   Left ovary   Measurements: Not visualized due to bowel gas = volume: mL. Normal appearance/no adnexal mass.   Pulsed Doppler evaluation of both ovaries demonstrates normal low-resistance arterial and venous waveforms.   Other findings   Incidentally a 7 x 7 x 9 mm echogenic nodular mass in the posterior wall of the bladder, the possibility of a transitional carcinoma should be excluded and correlation with cystoscopy recommended   IMPRESSION: *Echogenic mass in the endometrial cavity measures 1.9 x 1.0 x 1.5 cm. Polypoid lesion versus other endometrial masses. Biopsy is recommended. *Incidentally a 7 x 7 x 9 mm echogenic nodular mass in the posterior wall of the bladder, the possibility of a transitional carcinoma should be excluded and correlation with cystoscopy recommended.  Endometrial Biopsy Procedure Note  The patient was positioned on the exam table in the dorsal lithotomy position. Bimanual exam confirmed uterine position and size. A Graves speculum was placed into the vagina. A single toothed tenaculum was placed onto the anterior lip of the cervix. The pipette was placed into the endocervical canal and advanced to the uterine fundus. Using a piston like technique, with vacuum created by withdrawing the stylus, the endometrial specimen was obtained and transferred to  the biopsy container. Minimal bleeding encountered. The procedure was well tolerated.   Uterine Position: mid anterior posterior   Uterine Length: 7cm   Uterine Specimen: Brown-tinged fluid  Assessment/Plan:   1. Endometrial mass   2. Postmenopausal bleeding   3. Baden-Walker  grade 2 cystocele    EMB done today as above without complication, sent to pathology, will call with results.  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  Do not feel exercises will help much at her stage. Pt would like to consider options further, education materials provided today. Will contact us  if desiring pessary fitting or surgical consultation.  Follow up as needed.    Sofia Dunn, DO [facility]

## 2024-02-05 NOTE — Progress Notes (Addendum)
   02/15/24  CC:  Chief Complaint  Patient presents with   Cysto    HPI: 73 y/o female who underwent a transvaginal ultrasound for further evaluation of post menopausal bleeding.    Incidentally, the ultrasound revealed a 9 mm nodular mass on the posterior bladder wall, raising concerns for possible urothelial carcinoma. She does not have any recent urinalyses.   Blood pressure (!) 154/86, pulse 79, height 5' (1.524 m), weight 120 lb (54.4 kg). NED. A&Ox3.   No respiratory distress   Abd soft, NT, ND Normal external genitalia with patent urethral meatus  Cystoscopy Procedure Note  Patient identification was confirmed, informed consent was obtained, and patient was prepped using Betadine solution.  Lidocaine jelly was administered per urethral meatus.    Procedure: - Flexible cystoscope introduced, without any difficulty.   - Thorough search of the bladder revealed:    normal urethral meatus    normal urothelium without evidence of bladder mass    no stones    no ulcers     no tumors    no urethral polyps    no trabeculation  - Ureteral orifices were normal in position and appearance.  Post-Procedure: - Patient tolerated the procedure well  Assessment/ Plan:  1. Postmenopausal bleeding (Primary) NED in bladder  Suspect GYN etiology, has f/u next week for further eval    Dustin Gimenez, MD

## 2024-02-06 LAB — SURGICAL PATHOLOGY

## 2024-02-11 ENCOUNTER — Encounter: Payer: Self-pay | Admitting: Obstetrics

## 2024-02-11 NOTE — Telephone Encounter (Signed)
 Patient missed call. Advised of Dr. Raechel Bulla message sent to her via my chart. Patient states she has not had any bleeding until today. She believes she will be fine, but will call to schedule an appointment if she changes her mind.

## 2024-02-12 ENCOUNTER — Ambulatory Visit: Admitting: Physician Assistant

## 2024-03-04 ENCOUNTER — Ambulatory Visit: Payer: Medicare Other | Admitting: Dermatology

## 2024-03-29 ENCOUNTER — Ambulatory Visit: Admitting: Dermatology

## 2024-03-31 DIAGNOSIS — C8400 Mycosis fungoides, unspecified site: Secondary | ICD-10-CM | POA: Diagnosis not present

## 2024-03-31 DIAGNOSIS — Z79899 Other long term (current) drug therapy: Secondary | ICD-10-CM | POA: Diagnosis not present

## 2024-04-02 ENCOUNTER — Emergency Department

## 2024-04-02 ENCOUNTER — Other Ambulatory Visit: Payer: Self-pay

## 2024-04-02 ENCOUNTER — Emergency Department
Admission: EM | Admit: 2024-04-02 | Discharge: 2024-04-02 | Disposition: A | Discharge status: Home / Self Care | Attending: Emergency Medicine | Admitting: Emergency Medicine

## 2024-04-02 DIAGNOSIS — N132 Hydronephrosis with renal and ureteral calculous obstruction: Secondary | ICD-10-CM | POA: Insufficient documentation

## 2024-04-02 DIAGNOSIS — Q600 Renal agenesis, unilateral: Secondary | ICD-10-CM | POA: Diagnosis not present

## 2024-04-02 DIAGNOSIS — N200 Calculus of kidney: Secondary | ICD-10-CM | POA: Diagnosis not present

## 2024-04-02 DIAGNOSIS — N134 Hydroureter: Secondary | ICD-10-CM | POA: Diagnosis not present

## 2024-04-02 DIAGNOSIS — R9431 Abnormal electrocardiogram [ECG] [EKG]: Secondary | ICD-10-CM | POA: Diagnosis not present

## 2024-04-02 DIAGNOSIS — R1012 Left upper quadrant pain: Secondary | ICD-10-CM | POA: Diagnosis not present

## 2024-04-02 DIAGNOSIS — D1803 Hemangioma of intra-abdominal structures: Secondary | ICD-10-CM | POA: Diagnosis not present

## 2024-04-02 LAB — URINALYSIS, ROUTINE W REFLEX MICROSCOPIC
Bilirubin Urine: NEGATIVE
Glucose, UA: NEGATIVE mg/dL
Hgb urine dipstick: NEGATIVE
Ketones, ur: 20 mg/dL — AB
Leukocytes,Ua: NEGATIVE
Nitrite: NEGATIVE
Protein, ur: NEGATIVE mg/dL
Specific Gravity, Urine: 1.034 — ABNORMAL HIGH (ref 1.005–1.030)
Squamous Epithelial / HPF: 0 /HPF (ref 0–5)
pH: 7 (ref 5.0–8.0)

## 2024-04-02 LAB — COMPREHENSIVE METABOLIC PANEL WITH GFR
ALT: 26 U/L (ref 0–44)
AST: 39 U/L (ref 15–41)
Albumin: 4.1 g/dL (ref 3.5–5.0)
Alkaline Phosphatase: 66 U/L (ref 38–126)
Anion gap: 13 (ref 5–15)
BUN: 17 mg/dL (ref 8–23)
CO2: 21 mmol/L — ABNORMAL LOW (ref 22–32)
Calcium: 9.2 mg/dL (ref 8.9–10.3)
Chloride: 107 mmol/L (ref 98–111)
Creatinine, Ser: 0.5 mg/dL (ref 0.44–1.00)
GFR, Estimated: >60 mL/min (ref >60)
Glucose, Bld: 128 mg/dL — ABNORMAL HIGH (ref 70–99)
Potassium: 3.2 mmol/L — ABNORMAL LOW (ref 3.5–5.1)
Sodium: 141 mmol/L (ref 135–145)
Total Bilirubin: 0.6 mg/dL (ref 0.0–1.2)
Total Protein: 7 g/dL (ref 6.5–8.1)

## 2024-04-02 LAB — CBC WITH DIFFERENTIAL/PLATELET
Abs Immature Granulocytes: 0.03 10*3/uL (ref 0.00–0.07)
Basophils Absolute: 0 10*3/uL (ref 0.0–0.1)
Basophils Relative: 0 %
Eosinophils Absolute: 0 10*3/uL (ref 0.0–0.5)
Eosinophils Relative: 0 %
HCT: 34.3 % — ABNORMAL LOW (ref 36.0–46.0)
Hemoglobin: 12.1 g/dL (ref 12.0–15.0)
Immature Granulocytes: 0 %
Lymphocytes Relative: 28 %
Lymphs Abs: 2 10*3/uL (ref 0.7–4.0)
MCH: 31.5 pg (ref 26.0–34.0)
MCHC: 35.3 g/dL (ref 30.0–36.0)
MCV: 89.3 fL (ref 80.0–100.0)
Monocytes Absolute: 0.7 10*3/uL (ref 0.1–1.0)
Monocytes Relative: 9 %
Neutro Abs: 4.6 10*3/uL (ref 1.7–7.7)
Neutrophils Relative %: 63 %
Platelets: 187 10*3/uL (ref 150–400)
RBC: 3.84 MIL/uL — ABNORMAL LOW (ref 3.87–5.11)
RDW: 12.1 % (ref 11.5–15.5)
WBC: 7.3 10*3/uL (ref 4.0–10.5)
nRBC: 0 % (ref 0.0–0.2)

## 2024-04-02 LAB — TROPONIN I (HIGH SENSITIVITY): Troponin I (High Sensitivity): 11 ng/L (ref <18)

## 2024-04-02 LAB — LIPASE, BLOOD: Lipase: 27 U/L (ref 11–51)

## 2024-04-02 MED ORDER — IOHEXOL 350 MG/ML SOLN
75.0000 mL | Freq: Once | INTRAVENOUS | Status: AC | PRN
  Administered 2024-04-02: 75 mL via INTRAVENOUS

## 2024-04-02 MED ORDER — OXYCODONE HCL 5 MG PO TABS: 2.5000 mg | ORAL_TABLET | Freq: Four times a day (QID) | ORAL | 0 refills | Status: AC | PRN

## 2024-04-02 MED ORDER — TAMSULOSIN HCL 0.4 MG PO CAPS: 0.4000 mg | ORAL_CAPSULE | Freq: Every day | ORAL | 0 refills | Status: AC

## 2024-04-02 MED ORDER — IBUPROFEN 600 MG PO TABS: 600.0000 mg | ORAL_TABLET | Freq: Four times a day (QID) | ORAL | 0 refills | Status: AC | PRN

## 2024-04-02 MED ORDER — FENTANYL CITRATE PF 50 MCG/ML IJ SOSY: 50.0000 ug | PREFILLED_SYRINGE | Freq: Once | INTRAMUSCULAR | Status: DC

## 2024-04-02 MED ORDER — ONDANSETRON 4 MG PO TBDP: 4.0000 mg | ORAL_TABLET | Freq: Three times a day (TID) | ORAL | 0 refills | Status: AC | PRN

## 2024-04-02 MED ORDER — ACETAMINOPHEN 500 MG PO TABS
1000.0000 mg | ORAL_TABLET | Freq: Once | ORAL | Status: AC
  Administered 2024-04-02: 1000 mg via ORAL
  Filled 2024-04-02: qty 2

## 2024-04-02 MED ORDER — ONDANSETRON HCL 4 MG/2ML IJ SOLN
4.0000 mg | Freq: Once | INTRAMUSCULAR | Status: AC
  Administered 2024-04-02: 4 mg via INTRAVENOUS
  Filled 2024-04-02: qty 2

## 2024-04-02 MED ORDER — PANTOPRAZOLE SODIUM 40 MG IV SOLR
40.0000 mg | Freq: Once | INTRAVENOUS | Status: AC
  Administered 2024-04-02: 40 mg via INTRAVENOUS
  Filled 2024-04-02: qty 10

## 2024-04-02 MED ORDER — SODIUM CHLORIDE 0.9 % IV BOLUS
1000.0000 mL | Freq: Once | INTRAVENOUS | Status: AC
  Administered 2024-04-02: 1000 mL via INTRAVENOUS

## 2024-04-02 NOTE — ED Triage Notes (Signed)
 Pt BIB [facility] from home c/o NVD that started 24 hours ago. Pt denies a fever or being around any one sick. Pt endorses L upper quadrant pain. AXO, NAD.  158/80 98 Temp 83 HR 131 cbg

## 2024-04-02 NOTE — ED Provider Notes (Addendum)
 [facility] Provider Note    Event Date/Time   First MD Initiated Contact with Patient 04/02/24 [phone]     (approximate)   History   Nausea, Emesis, and Diarrhea   HPI  Jeanne Lewis is a 73 y.o. female with history of endometrial mass who comes in with nausea vomiting diarrhea.  Patient states that she did eat [employer] but denies any known foods that could have caused this.  She states has been ongoing for over 24 hours with multiple episodes of diarrhea nausea, vomiting.  She denies being around anybody who has been sick denies any antibiotics to suggest C. difficile.  She reports left upper abdominal pain.  A little bit of blood noted in the stool but denies any chest pain, shortness of breath, falls or hitting her head or other concerns.       Physical Exam   Triage Vital Signs: ED Triage Vitals  Encounter Vitals Group     BP 04/02/24 0747 (!) 158/135     Girls Systolic BP Percentile --      Girls Diastolic BP Percentile --      Boys Systolic BP Percentile --      Boys Diastolic BP Percentile --      Pulse Rate 04/02/24 0747 87     Resp 04/02/24 0747 18     Temp --      Temp src --      SpO2 04/02/24 0747 100 %     Weight 04/02/24 0748 126 lb 6.4 oz (57.3 kg)     Height 04/02/24 0748 5' (1.524 m)     Head Circumference --      Peak Flow --      Pain Score 04/02/24 0748 7     Pain Loc --      Pain Education --      Exclude from Growth Chart --     Most recent vital signs: Vitals:   04/02/24 0747  BP: (!) 158/135  Pulse: 87  Resp: 18  SpO2: 100%     General: Awake, no distress.  CV:  Good peripheral perfusion.  Resp:  Normal effort.  Abd:  No distention.  Tender in the left side of her abdomen Other:  Patient declined rectal exam   ED Results / Procedures / Treatments   Labs (all labs ordered are listed, but only abnormal results are displayed) Labs Reviewed  CBC WITH DIFFERENTIAL/PLATELET - Abnormal; Notable for the  following components:      Result Value   RBC 3.84 (*)    HCT 34.3 (*)    All other components within normal limits  COMPREHENSIVE METABOLIC PANEL WITH GFR - Abnormal; Notable for the following components:   Potassium 3.2 (*)    CO2 21 (*)    Glucose, Bld 128 (*)    All other components within normal limits  URINE CULTURE  LIPASE, BLOOD  URINALYSIS, ROUTINE W REFLEX MICROSCOPIC  TROPONIN I (HIGH SENSITIVITY)     EKG  My interpretation of EKG:  Good amount of artifact but appears sinus with a rate of 86 without any ST elevation or T wave inversions, normal intervals will get a repeat  Repeat EKG does appear sinus with a rate of 81 without any ST elevation or T wave inversions, normal intervals  RADIOLOGY I have reviewed the ct personally and interpreted positive kidney stone   PROCEDURES:  Critical Care performed: No  .1-3 Lead EKG Interpretation  Performed by:  Lubertha Rush, MD Authorized by: Lubertha Rush, MD     Interpretation: normal     ECG rate:  80   ECG rate assessment: normal     Rhythm: sinus rhythm     Ectopy: none     Conduction: normal      MEDICATIONS ORDERED IN ED: Medications - No data to display   IMPRESSION / MDM / ASSESSMENT AND PLAN / ED COURSE  I reviewed the triage vital signs and the nursing notes.   Patient's presentation is most consistent with acute presentation with potential threat to life or bodily function.   Patient comes in with abdominal pain, nausea vomiting diarrhea pain on her left side of her abdomen.  Initially she reported maybe a tiny bit of blood that she thought was from her stool.  Therefore I ordered a CT mesenteric ischemic protocol to evaluate for ischemic colitis.  Differential also includes diverticulitis, kidney stone, perforation, obstruction patient treated symptomatically with fluids, Protonix, Tylenol, Zofran   Troponin negative she had denied any chest pain.  I ordered this as she had had some left-sided  back pain but this has been ongoing for over 24 hours with negative troponin and unlikely ACS.  CBC does not show elevated white count.  CMP shows slightly low potassium otherwise reassuring.  Lipase is normal  IMPRESSION: 1. Normal contour and caliber of the abdominal aorta. No evidence of aneurysm, dissection, or other acute aortic pathology. Minimal noncalcific atherosclerosis. 2. The aortic branch vessel origins are widely patent; particularly the superior mesenteric artery is patent through its proximal order branches. 3. There is a 0.6 cm calculus in the middle third of the left ureter with moderate associated hydronephrosis and proximal hydroureter as well as perinephric fat stranding. Additional punctuate nonobstructive left renal calculi. 4. No evidence of lymphadenopathy or metastatic disease in the abdomen or pelvis.  On repeat assessment patient reports feeling much improved.  We discussed doing rectal exam to evaluate for any blood but she declined.  She states that she is not even sure if it was in her stool and given the CT imaging I suspect that was actually more likely in her urine.  She just reports a little bit of tiny blood when she wiped.  We discussed treatment for kidney stones including ibuprofen, Tylenol, oxycodone for breakthrough pain, Zofran, Flomax.  We discussed her morphine allergy but sounds like it was an IV injection that caused her to lose vision does not sound like true allergic reaction.  She is comfortable trialing low-dose oxycodone will return for any allergic reaction symptoms.  Her urine was ordered to make sure no evidence of UTI she understands the importance of returning for fevers but this time she feels comfortable with discharge home will follow-up outpatient with urology.  She has tolerated p.o. here and feels comfortable with discharge and will return for intractable nausea, vomiting, fever  UA negative for UTI.   The patient is on the  cardiac monitor to evaluate for evidence of arrhythmia and/or significant heart rate changes.      FINAL CLINICAL IMPRESSION(S) / ED DIAGNOSES   Final diagnoses:  Kidney stone     Rx / DC Orders   ED Discharge Orders          Ordered    oxyCODONE (ROXICODONE) 5 MG immediate release tablet  Every 6 hours PRN        04/02/24 1035    tamsulosin (FLOMAX) 0.4 MG CAPS capsule  Daily  04/02/24 1035    ondansetron (ZOFRAN-ODT) 4 MG disintegrating tablet  Every 8 hours PRN        04/02/24 1035    ibuprofen (ADVIL) 600 MG tablet  Every 6 hours PRN        04/02/24 1035             Note:  This document was prepared using Dragon voice recognition software and may include unintentional dictation errors.   Lubertha Rush, MD 04/02/24 1036    Lubertha Rush, MD 04/02/24 1051

## 2024-04-02 NOTE — Discharge Instructions (Addendum)
 You have a kidney stone. See report below.   Take ibuprofen 600mg  every 6-8 hours daily (as long as you are not on any other blood thinners or have kidney disease) Take tylenol 1g every 8 hours daily. Take oxycodone for breakthrough pain. Do not drive, work, or operate machinery while on this.  Take zofran to help with nausea. Take Flomax to help dilate uretha. Call urology number above to schedule outpatient appointment. Return to ED for fevers, unable to keep food down, or any other concerns.   IMPRESSION: 1. Normal contour and caliber of the abdominal aorta. No evidence of aneurysm, dissection, or other acute aortic pathology. Minimal noncalcific atherosclerosis. 2. The aortic branch vessel origins are widely patent; particularly the superior mesenteric artery is patent through its proximal order branches. 3. There is a 0.6 cm calculus in the middle third of the left ureter with moderate associated hydronephrosis and proximal hydroureter as well as perinephric fat stranding. Additional punctuate nonobstructive left renal calculi. 4. No evidence of lymphadenopathy or metastatic disease in the abdomen or pelvis.

## 2024-04-02 NOTE — ED Notes (Signed)
 Pt provided with a sprite for PO challenge. Pt made aware to let staff know if she has any nausea or vomiting after attempting sips.

## 2024-04-03 LAB — URINE CULTURE: Culture: NO GROWTH

## 2024-04-09 ENCOUNTER — Inpatient Hospital Stay: Admitting: Family Medicine

## 2024-04-16 ENCOUNTER — Inpatient Hospital Stay: Admitting: Family Medicine

## 2024-04-27 ENCOUNTER — Ambulatory Visit: Payer: Self-pay

## 2024-04-27 NOTE — Telephone Encounter (Signed)
 Reviewed. Agree with scheduled appointment

## 2024-04-27 NOTE — Telephone Encounter (Signed)
 FYI Only or Action Required?: Action required by provider: request for appointment.  Patient was last seen in primary care on 12/04/2023 by Sharma Coyer, MD.  Called Nurse Triage reporting Flank Pain.  Symptoms began several days ago.  Interventions attempted: Rest, hydration, or home remedies.  Symptoms are: stable.  Triage Disposition: Home Care  Patient/caregiver understands and will follow disposition?: Yes

## 2024-04-27 NOTE — Telephone Encounter (Signed)
 Reason for Disposition . [1] Kidney stone diagnosed by doctor (or NP/PA) AND [2] normal symptoms (e.g., nausea, pain) AND [3] no complications  Answer Assessment - Initial Assessment Questions 1. MAIN CONCERN OR SYMPTOM:  What is your main concern right now? What question do you have? What's the main symptom you're worried about? (e.g., blood in urine, flank pain)     Left side 2. ONSET: When did the  pain  start?     Last night 3. BETTER-SAME-WORSE: Are you getting better, staying the same, or getting worse compared to how you felt at your last visit to the doctor (most recent medical visit)?     better 4. VISIT DATE: When were you seen? (Date)     6/13 5. VISIT DOCTOR: What is the name of the doctor (or NP/PA) taking care of you now?     Na  6. VISIT DIAGNOSIS:  What was the main symptom or problem that you were seen for? Were you given a diagnosis?      6/13 7. TREATMENT: Did you have any treatment for your kidney stone? (e.g., none, doctor exam, lithotripsy, medicines, stent) If Yes, ask: When did you have this treatment?     Medication for nausea 8. NEXT APPOINTMENT: Do you have a follow-up appointment with your doctor?     na 9. PAIN: Is there any pain? If Yes, ask: How bad is it?  (Scale 0-10; or mild, moderate, severe)    - NONE (0): no pain    - MILD (1-3): doesn't interfere with normal activities     - MODERATE (4-7): interferes with normal activities or awakens from sleep     - SEVERE (8-10): excruciating pain, unable to do any normal activities     2 10. FEVER: Do you have a fever? If Yes, ask: What is it, how was it measured, and when did it start?       Not sure 11. OTHER SYMPTOMS: Do you have any other symptoms? (e.g., abdomen pain, blood in urine, vomiting)        Nausea and vomiting    Pt  was seen in ED for kidney stones and feels she has another one. Pt feels better today compared to last night. Pain is 2/10.  Pt was prescribed  medication for pain/ nausea/vomiting. RN gave home care advice until hospital follow appt on 7/14. PT advised to seek care at ED again if symptoms progress.  Protocols used: Kidney Stone Follow-up Call-A-AH

## 2024-05-03 ENCOUNTER — Ambulatory Visit: Admitting: Family Medicine

## 2024-05-03 NOTE — Progress Notes (Deleted)
      ACUTE VISIT   Patient: Jeanne Lewis   DOB: Aug 30, 1951   73 y.o. Female  MRN: [id number]   PCP: Ostwalt, Janna, PA-C  No chief complaint on file.  Subjective    HPI   Discussed the use of AI scribe software for clinical note transcription with the patient, who gave verbal consent to proceed.  History of Present Illness      Medications: Outpatient Medications Prior to Visit  Medication Sig   Acetaminophen  500 MG capsule Take 500 mg by mouth every 4 (four) hours as needed. Patient takes 2 (500mg  Caplets) in the morning and 2  (500 mg caplets) in the PM.   Ascorbic Acid (VITAMIN C) 1000 MG tablet Take 1,000 mg by mouth daily.   aspirin 81 MG tablet Take 81 mg by mouth daily as needed. Reported on 04/10/2016   Cholecalciferol (VITAMIN D-3) 1000 UNITS CAPS Take 1 capsule by mouth daily at 6 (six) AM. Reported on 04/10/2016   fluticasone (FLONASE) 50 MCG/ACT nasal spray As needed   loratadine (CLARITIN) 10 MG tablet Take 10 mg by mouth daily.   loratadine-pseudoephedrine (CLARITIN-D 24 HOUR) 10-240 MG 24 hr tablet As needed   NON FORMULARY Take 1 capsule by mouth daily. Tumeric   PEGASYS 180 MCG/ML injection INJECT 90MCG (0.5ML)  SUBCUTANEOUSLY WEEKLY (DISCARD  UNUSED MEDICATION EACH VIAL)   No facility-administered medications prior to visit.    {Insert previous labs (optional):23779} {See past labs  Heme  Chem  Endocrine  Serology  Results Review (optional):1}   Objective    There were no vitals taken for this visit. {Insert last BP/Wt (optional):23777}{See vitals history (optional):1}  Physical Exam   Physical Exam    No results found for any visits on 05/03/24.  Assessment & Plan     Assessment and Plan Assessment & Plan       No follow-ups on file.        Rockie Agent, MD  [facility] [phone] (phone) [phone] (fax)  [facility]

## 2024-05-05 ENCOUNTER — Ambulatory Visit

## 2024-05-05 ENCOUNTER — Ambulatory Visit (INDEPENDENT_AMBULATORY_CARE_PROVIDER_SITE_OTHER): Admitting: Family Medicine

## 2024-05-05 ENCOUNTER — Encounter: Payer: Self-pay | Admitting: Family Medicine

## 2024-05-05 VITALS — HR 77 | Resp 16 | Ht 60.0 in | Wt 117.0 lb

## 2024-05-05 DIAGNOSIS — Z1231 Encounter for screening mammogram for malignant neoplasm of breast: Secondary | ICD-10-CM

## 2024-05-05 DIAGNOSIS — Z87442 Personal history of urinary calculi: Secondary | ICD-10-CM | POA: Insufficient documentation

## 2024-05-05 DIAGNOSIS — Z0000 Encounter for general adult medical examination without abnormal findings: Secondary | ICD-10-CM | POA: Diagnosis not present

## 2024-05-05 DIAGNOSIS — N23 Unspecified renal colic: Secondary | ICD-10-CM

## 2024-05-05 NOTE — Progress Notes (Signed)
 Subjective:   Jeanne Lewis is a 73 y.o. who presents for a Medicare Wellness preventive visit.  As a reminder, Annual Wellness Visits don't include a physical exam, and some assessments may be limited, especially if this visit is performed virtually. We may recommend an in-person follow-up visit with your provider if needed.  Visit Complete: Virtual I connected with  Jeanne Lewis on 05/05/24 by a audio enabled telemedicine application and verified that I am speaking with the correct person using two identifiers.  Patient Location: Home  Provider Location: Home Office  I discussed the limitations of evaluation and management by telemedicine. The patient expressed understanding and agreed to proceed.  Vital Signs: Because this visit was a virtual/telehealth visit, some criteria may be missing or patient reported. Any vitals not documented were not able to be obtained and vitals that have been documented are patient reported.  VideoDeclined- This patient declined Librarian, academic. Therefore the visit was completed with audio only.  Persons Participating in Visit: Patient.  AWV Questionnaire: No: Patient Medicare AWV questionnaire was not completed prior to this visit.  Cardiac Risk Factors include: advanced age (>55men, >87 women);dyslipidemia;sedentary lifestyle     Objective:    There were no vitals filed for this visit. There is no height or weight on file to calculate BMI.     05/05/2024    3:18 PM 04/02/2024    7:49 AM 04/16/2017   10:58 AM  Advanced Directives  Does Patient Have a Medical Advance Directive? No No No   Would patient like information on creating a medical advance directive? No - Patient declined  No - Patient declined      Data saved with a previous flowsheet row definition    Current Medications (verified) Outpatient Encounter Medications as of 05/05/2024  Medication Sig   Acetaminophen  500 MG capsule Take 500 mg by mouth  every 4 (four) hours as needed. Patient takes 2 (500mg  Caplets) in the morning and 2  (500 mg caplets) in the PM.   Ascorbic Acid (VITAMIN C) 1000 MG tablet Take 1,000 mg by mouth daily.   aspirin 81 MG tablet Take 81 mg by mouth daily as needed. Reported on 04/10/2016   Cholecalciferol (VITAMIN D-3) 1000 UNITS CAPS Take 1 capsule by mouth daily at 6 (six) AM. Reported on 04/10/2016   fluticasone (FLONASE) 50 MCG/ACT nasal spray As needed   loratadine (CLARITIN) 10 MG tablet Take 10 mg by mouth daily.   loratadine-pseudoephedrine (CLARITIN-D 24 HOUR) 10-240 MG 24 hr tablet As needed   NON FORMULARY Take 1 capsule by mouth daily. Tumeric   PEGASYS 180 MCG/ML injection INJECT 90MCG (0.5ML)  SUBCUTANEOUSLY WEEKLY (DISCARD  UNUSED MEDICATION EACH VIAL)   No facility-administered encounter medications on file as of 05/05/2024.    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   Past Surgical History:  Procedure Laterality Date   WISDOM TOOTH EXTRACTION     Family History  Problem Relation Age of Onset   Macular degeneration Mother    Breast cancer Maternal Aunt 30   Cancer Paternal Uncle    Social History   Socioeconomic History   Marital status: Widowed    Spouse name: Not on file   Number of children: Not on file  Years of education: Not on file   Highest education level: Not on file  Occupational History   Not on file  Tobacco Use   Smoking status: Never   Smokeless tobacco: Never  Vaping Use   Vaping status: Never Used  Substance and Sexual Activity   Alcohol use: No   Drug use: No   Sexual activity: Not Currently  Other Topics Concern   Not on file  Social History Narrative   Not on file   Social Drivers of Health    Financial Resource Strain: Low Risk  (05/05/2024)   Overall Financial Resource Strain (CARDIA)    Difficulty of Paying Living Expenses: Not hard at all  Food Insecurity: No Food Insecurity (05/05/2024)   Hunger Vital Sign    Worried About Running Out of Food in the Last Year: Never true    Ran Out of Food in the Last Year: Never true  Transportation Needs: No Transportation Needs (05/05/2024)   PRAPARE - Administrator, Civil Service (Medical): No    Lack of Transportation (Non-Medical): No  Physical Activity: Insufficiently Active (05/05/2024)   Exercise Vital Sign    Days of Exercise per Week: 2 days    Minutes of Exercise per Session: 30 min  Stress: No Stress Concern Present (05/05/2024)   [employer] of Occupational Health - Occupational Stress Questionnaire    Feeling of Stress: Only a little  Social Connections: Moderately Isolated (05/05/2024)   Social Connection and Isolation Panel    Frequency of Communication with Friends and Family: More than three times a week    Frequency of Social Gatherings with Friends and Family: Never    Attends Religious Services: More than 4 times per year    Active Member of [employer] or Organizations: No    Attends Banker Meetings: Never    Marital Status: Widowed    Tobacco Counseling Counseling given: Not Answered    Clinical Intake:  Pre-visit preparation completed: Yes  Pain : No/denies pain     BMI - recorded: 22.8 Nutritional Status: BMI of 19-24  Normal Nutritional Risks: None Diabetes: No  No results found for: HGBA1C   How often do you need to have someone help you when you read instructions, pamphlets, or other written materials from your doctor or pharmacy?: 1 - Never  Interpreter Needed?: No  Information entered by :: JHONNIE DAS, LPN   Activities of Daily Living     05/05/2024    3:21 PM  In your present state of health, do you have any difficulty performing the following  activities:  Hearing? 0  Vision? 0  Difficulty concentrating or making decisions? 1  Walking or climbing stairs? 1  Comment KNEE PAIN GOING DOWN  Dressing or bathing? 0  Doing errands, shopping? 0  Preparing Food and eating ? N  Using the Toilet? N  In the past six months, have you accidently leaked urine? N  Do you have problems with loss of bowel control? N  Managing your Medications? N  Managing your Finances? N  Housekeeping or managing your Housekeeping? N    Patient Care Team: Ostwalt, Janna, PA-C as PCP - General (Physician Assistant) Samuella Twyla HERO, PA-C (Gastroenterology) [facility],  [facility] (Optometry)  I have updated your Care Teams any recent Medical Services you may have received from other providers in the past year.     Assessment:   This is a routine wellness examination for Jeanne Lewis.  Hearing/Vision screen Hearing Screening -  Comments:: NO AIDS Vision Screening - Comments:: WEARS GLASSES ALL DAY- [facility]   Goals Addressed             This Visit's Progress    DIET - EAT MORE FRUITS AND VEGETABLES         Depression Screen     05/05/2024    3:14 PM 11/04/2023    3:32 PM 11/04/2023    3:26 PM  PHQ 2/9 Scores  PHQ - 2 Score 0 0 0  PHQ- 9 Score 0 2 2    Fall Risk     05/05/2024    3:20 PM 11/04/2023    3:26 PM  Fall Risk   Falls in the past year? 1 0  Number falls in past yr: 1 0  Injury with Fall? 0 0  Risk for fall due to : History of fall(s)   Follow up Falls evaluation completed;Falls prevention discussed     MEDICARE RISK AT HOME:  Medicare Risk at Home Any stairs in or around the home?: Yes If so, are there any without handrails?: No Home free of loose throw rugs in walkways, pet beds, electrical cords, etc?: Yes Adequate lighting in your home to reduce risk of falls?: Yes Life alert?: No Use of a cane, walker or w/c?: No Grab bars in the bathroom?: Yes Shower chair or bench in shower?: No Elevated toilet seat or a  handicapped toilet?: Yes  TIMED UP AND GO:  Was the test performed?  No  Cognitive Function: 6CIT completed        05/05/2024    3:24 PM  6CIT Screen  What Year? 0 points  What month? 0 points  What time? 0 points  Count back from 20 0 points  Months in reverse 0 points  Repeat phrase 0 points  Total Score 0 points    Immunizations Immunization History  Administered Date(s) Administered   Moderna Covid-19 Fall Seasonal Vaccine 57yrs & older 05/02/2020    Screening Tests Health Maintenance  Topic Date Due   Hepatitis C Screening  Never done   DTaP/Tdap/Td (1 - Tdap) Never done   Pneumococcal Vaccine: 50+ Years (1 of 2 - PCV) Never done   Zoster Vaccines- Shingrix (1 of 2) Never done   DEXA SCAN  Never done   COVID-19 Vaccine (2 - Moderna risk series) 05/30/2020   MAMMOGRAM  05/18/2022   Colonoscopy  12/25/2023   INFLUENZA VACCINE  05/21/2024   Medicare Annual Wellness (AWV)  05/05/2025   Hepatitis B Vaccines  Aged Out   HPV VACCINES  Aged Out   Meningococcal B Vaccine  Aged Out    Health Maintenance  Health Maintenance Due  Topic Date Due   Hepatitis C Screening  Never done   DTaP/Tdap/Td (1 - Tdap) Never done   Pneumococcal Vaccine: 50+ Years (1 of 2 - PCV) Never done   Zoster Vaccines- Shingrix (1 of 2) Never done   DEXA SCAN  Never done   COVID-19 Vaccine (2 - Moderna risk series) 05/30/2020   MAMMOGRAM  05/18/2022   Colonoscopy  12/25/2023   Health Maintenance Items Addressed: Mammogram ordered; DECLINED COLONOSCOPY & BDS REFERRAL; DECLINES COVID SHOTS, NEEDS PNA, SHINGRIX & TDAP  Additional Screening:  Vision Screening: Recommended annual ophthalmology exams for early detection of glaucoma and other disorders of the eye. Would you like a referral to an eye doctor? No    Dental Screening: Recommended annual dental exams for proper oral hygiene  Community Resource Referral /  Chronic Care Management: CRR required this visit?  No   CCM required  this visit?  No   Plan:    I have personally reviewed and noted the following in the patient's chart:   Medical and social history Use of alcohol, tobacco or illicit drugs  Current medications and supplements including opioid prescriptions. Patient is not currently taking opioid prescriptions. Functional ability and status Nutritional status Physical activity Advanced directives List of other physicians Hospitalizations, surgeries, and ER visits in previous 12 months Vitals Screenings to include cognitive, depression, and falls Referrals and appointments  In addition, I have reviewed and discussed with patient certain preventive protocols, quality metrics, and best practice recommendations. A written personalized care plan for preventive services as well as general preventive health recommendations were provided to patient.   Jhonnie GORMAN Das, LPN   2/83/7974   After Visit Summary: (MyChart) Due to this being a telephonic visit, the after visit summary with patients personalized plan was offered to patient via MyChart   Notes: MAMMOGRAM REFERRAL SENT

## 2024-05-05 NOTE — Progress Notes (Signed)
 Established patient visit   Patient: Jeanne Lewis   DOB: 09/30/1951   73 y.o. Female  MRN: [id number] Visit Date: 05/05/2024  Today's healthcare provider: Nancyann Perry, MD   Chief Complaint  Patient presents with   Flank Pain    Left side pain last week. Reports pain is less.    Subjective    HPI Discussed the use of AI scribe software for clinical note transcription with the patient, who gave verbal consent to proceed.  History of Present Illness   Jeanne Lewis is a 73 year old female with kidney stones who presents with recurrent left-sided flank pain.  She was seen in the emergency room on June 13th for left-sided flank pain, nausea, and vomiting, where she was diagnosed with a kidney stone. The pain initially resolved but recurred last week, prompting her to seek an appointment, which she missed due to poor sleep.  The pain returned in the middle of last week, described as 'little achy' and tender to touch. It was intermittent, with some days being pain-free, especially after taking her medication. She took her pain medication late at night, which may have contributed to the relief experienced the following day.  In the emergency room, she was prescribed oxycodone  for pain 14 days course of tamsulosin  for stone passage, and medication for nausea. She completed the tamsulosin  course two weeks ago and still has some oxycodone  and nausea medication left. She has been managing residual pain with Tylenol  and ibuprofen , and occasionally uses homeopathic remedies when available.  She experienced significant pressure, which she suspects might have been when she passed the stone. She was not provided with a strainer to confirm stone passage. She has been hydrating with water, lemon water, and cranberry juice, and has been avoiding tea and apple juice due to concerns about stone formation.  She recalls a similar pain episode in the remote past, which was attributed to a cyst, but she  now wonders if it was a kidney stone. She has not had any recent imaging to confirm the presence of stones.  She reports nausea, vomiting, and diarrhea during the recent pain episode.      Abdominal CT from 05-02-2024: There is a 0.6 cm calculus in the middle third of the left ureter with moderate associated hydronephrosis and proximal hydroureter as well as perinephric fat stranding. Additional punctuate nonobstructive left renal calculi.  Medications: Outpatient Medications Prior to Visit  Medication Sig   Acetaminophen  500 MG capsule Take 500 mg by mouth every 4 (four) hours as needed. Patient takes 2 (500mg  Caplets) in the morning and 2  (500 mg caplets) in the PM.   Ascorbic Acid (VITAMIN C) 1000 MG tablet Take 1,000 mg by mouth daily.   aspirin 81 MG tablet Take 81 mg by mouth daily as needed. Reported on 04/10/2016   Cholecalciferol (VITAMIN D-3) 1000 UNITS CAPS Take 1 capsule by mouth daily at 6 (six) AM. Reported on 04/10/2016   fluticasone (FLONASE) 50 MCG/ACT nasal spray As needed   loratadine (CLARITIN) 10 MG tablet Take 10 mg by mouth daily.   loratadine-pseudoephedrine (CLARITIN-D 24 HOUR) 10-240 MG 24 hr tablet As needed   NON FORMULARY Take 1 capsule by mouth daily. Tumeric   PEGASYS 180 MCG/ML injection INJECT 90MCG (0.5ML)  SUBCUTANEOUSLY WEEKLY (DISCARD  UNUSED MEDICATION EACH VIAL)   No facility-administered medications prior to visit.    Review of Systems     Objective    Pulse 77  Resp 16   Ht 5' (1.524 m)   Wt 117 lb (53.1 kg)   SpO2 99%   BMI 22.85 kg/m    Physical Exam   General: Appearance:    Well developed, well nourished female in no acute distress  Eyes:    PERRL, conjunctiva/corneas clear, EOM's intact       Lungs:     Clear to auscultation bilaterally, respirations unlabored  Heart:    Normal heart rate. Normal rhythm. No murmurs, rubs, or gallops.    Abd:   Mild left CVA tenderness  Neurologic:   Awake, alert, oriented x 3. No apparent focal  neurological defect.         Assessment & Plan     1. Renal colic on left side (Primary) Sx initially resolved while on tamsulosin  for the two weeks after ER visit. The stone in ureter likely passed during that time. Suspect that the second punctate stone passed when symptoms recurred last week. Is still slightly sore with mild CVAT, but greatly improve. Encourage drinking plenty of water to reduce risk of more stones forming.   2. History of kidney stones         Nancyann Perry, MD  [facility] [phone] (phone) [phone] (fax)  [facility]

## 2024-05-05 NOTE — Patient Instructions (Signed)
 Jeanne Lewis , Thank you for taking time out of your busy schedule to complete your Annual Wellness Visit with me. I enjoyed our conversation and look forward to speaking with you again next year. I, as well as your care team,  appreciate your ongoing commitment to your health goals. Please review the following plan we discussed and let me know if I can assist you in the future.  REFERRAL FOR MAMMOGRAM SENT You have an order for:  []   2D Mammogram  [x]   3D Mammogram  []   Bone Density     Please call for appointment:  [facility]  [address] [place] [address] [phone] [facility] [address] [place], [place] [address] [phone] [facility] [address] [place], [place] [address] [phone]   Make sure to wear two-piece clothing.  No lotions, powders, or deodorants the day of the appointment. Make sure to bring picture ID and insurance card.  Bring list of medications you are currently taking including any supplements.   Schedule your [place] screening mammogram through MyChart!   Log into your MyChart account.  Go to 'Visit' (or 'Appointments' if on mobile App) --> Schedule an Appointment  Under 'Select a Reason for Visit' choose the Mammogram Screening option.  Complete the pre-visit questions and select the time and place that best fits your schedule.   Follow up Visits: Next Medicare AWV with our clinical staff:   05/11/25 @ 10:50 AM BY PHONE Have you seen your provider in the last 6 months (3 months if uncontrolled diabetes)? Yes   Clinician Recommendations:  Aim for 30 minutes of exercise or brisk walking, 6-8 glasses of water, and 5 servings of fruits and vegetables each day. TAKE CARE!      This is a list of the screening recommended for you and due dates:  Health Maintenance  Topic Date Due   Hepatitis C Screening   Never done   DTaP/Tdap/Td vaccine (1 - Tdap) Never done   Pneumococcal Vaccine for age over 9 (1 of 2 - PCV) Never done   Zoster (Shingles) Vaccine (1 of 2) Never done   DEXA scan (bone density measurement)  Never done   COVID-19 Vaccine (2 - Moderna risk series) 05/30/2020   Mammogram  05/18/2022   Colon Cancer Screening  12/25/2023   Flu Shot  05/21/2024   Medicare Annual Wellness Visit  05/05/2025   Hepatitis B Vaccine  Aged Out   HPV Vaccine  Aged Out   Meningitis B Vaccine  Aged Out    Advanced directives: (ACP Link)Information on Advanced Care Planning can be found at [place]  Print production planner Health Care Directives Advance Health Care Directives. [email or web address]  Advance Care Planning is important because it:  [x]  Makes sure you receive the medical care that is consistent with your values, goals, and preferences  [x]  It provides guidance to your family and loved ones and reduces their decisional burden about whether or not they are making the right decisions based on your wishes.  Follow the link provided in your after visit summary or read over the paperwork we have mailed to you to help you started getting your Advance Directives in place. If you need assistance in completing these, please reach out to us  so that we can help you!

## 2024-05-11 ENCOUNTER — Ambulatory Visit: Admitting: Dermatology

## 2024-05-31 ENCOUNTER — Ambulatory Visit: Admitting: Dermatology

## 2024-05-31 DIAGNOSIS — D1801 Hemangioma of skin and subcutaneous tissue: Secondary | ICD-10-CM

## 2024-05-31 DIAGNOSIS — L578 Other skin changes due to chronic exposure to nonionizing radiation: Secondary | ICD-10-CM

## 2024-05-31 DIAGNOSIS — Z1283 Encounter for screening for malignant neoplasm of skin: Secondary | ICD-10-CM | POA: Diagnosis not present

## 2024-05-31 DIAGNOSIS — L821 Other seborrheic keratosis: Secondary | ICD-10-CM

## 2024-05-31 DIAGNOSIS — Z7189 Other specified counseling: Secondary | ICD-10-CM

## 2024-05-31 DIAGNOSIS — C84A0 Cutaneous T-cell lymphoma, unspecified, unspecified site: Secondary | ICD-10-CM

## 2024-05-31 DIAGNOSIS — L814 Other melanin hyperpigmentation: Secondary | ICD-10-CM

## 2024-05-31 DIAGNOSIS — L820 Inflamed seborrheic keratosis: Secondary | ICD-10-CM | POA: Diagnosis not present

## 2024-05-31 DIAGNOSIS — W908XXA Exposure to other nonionizing radiation, initial encounter: Secondary | ICD-10-CM

## 2024-05-31 NOTE — Progress Notes (Signed)
 Follow-Up Visit   Subjective  Jeanne Lewis is a 73 y.o. 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 The patient has spots, moles and lesions to be evaluated, some may be new or changing and the patient may have concern these could be cancer.  The following portions of the chart were reviewed this encounter and updated as appropriate: medications, allergies, medical history  Review of Systems:  No other skin or systemic complaints except as noted in HPI or Assessment and Plan.  Objective  Well appearing patient in no apparent distress; mood and affect are within normal limits.  A full examination was performed including scalp, head, eyes, ears, nose, lips, neck, chest, axillae, abdomen, back, buttocks, bilateral upper extremities, bilateral lower extremities, hands, feet, fingers, toes, fingernails, and toenails. All findings within normal limits unless otherwise noted below.   Relevant physical exam findings are noted in the Assessment and Plan.  right tricep x 1, left bicep x 1 (2) Erythematous stuck-on, waxy papule or plaque  Assessment & Plan   SKIN CANCER SCREENING PERFORMED TODAY.  ACTINIC DAMAGE - Chronic condition, secondary to cumulative UV/sun exposure - diffuse scaly erythematous macules with underlying dyspigmentation - Recommend daily broad spectrum sunscreen SPF 30+ to sun-exposed areas, reapply every 2 hours as needed.  - Staying in the shade or wearing long sleeves, sun glasses (UVA+UVB protection) and wide brim hats (4-inch brim around the entire circumference of the hat) are also recommended for sun protection.  - Call for new or changing lesions.  LENTIGINES, SEBORRHEIC KERATOSES, HEMANGIOMAS - Benign normal skin lesions -  Benign-appearing - Call for any changes  MELANOCYTIC NEVI - Tan-brown and/or pink-flesh-colored symmetric macules and papules - Benign appearing on exam today - Observation - Call clinic for new or changing moles - Recommend daily use of broad spectrum spf 30+ sunscreen to sun-exposed areas.    Rash = CTCL torso, arms, legs Exam: Patches of erythema with cigarette paper texture of arms neck trunk and thighs, lower legs are clear  No lymphadenopathy  at  axillary or neck   CTCL.  Mycosis Fungoides. Persistent.  Pt had been non-compliant with follow ups with dermatology in the past. 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   Pt lost to follow up at [facility] from 2003 until 2020 and pt returned again in 2021.  She had been seen at [facility] dermatology as recently as 2009. Patient sent for reevaluation with Dr. Chandra who she saw recently and Dr. Chandra did new biopsy October 2022 showing CTCL and Dr Flynn put patient on Peginterferon alfa-2a (PEGASYS) 180 mcg/mL injection  Continue current treatment regimen from Dr. Flynn.  Last visit with Dr Flynn was 10/2022  INFLAMED SEBORRHEIC KERATOSIS (2) right tricep x 1, left bicep x 1 (2) Symptomatic, irritating, patient would like treated. Destruction of lesion - right tricep x 1, left bicep x 1 (2) Complexity: simple   Destruction method: cryotherapy   Informed consent: discussed and consent obtained   Timeout:  patient name, date of birth, surgical site, and procedure verified Lesion destroyed using liquid nitrogen: Yes   Region frozen until ice ball extended  beyond lesion: Yes   Outcome: patient tolerated procedure well with no complications   Post-procedure details: wound care instructions given    Return in about 1 year (around 05/31/2025) for TBSE.  IEleanor Blush, CMA, am acting as scribe for Alm Rhyme,  MD.   Documentation: I have reviewed the above documentation for accuracy and completeness, and I agree with the above.  Alm Rhyme, MD

## 2024-05-31 NOTE — Patient Instructions (Addendum)

## 2024-06-01 ENCOUNTER — Encounter: Payer: Self-pay | Admitting: Dermatology

## 2024-06-01 DIAGNOSIS — K080 Exfoliation of teeth due to systemic causes: Secondary | ICD-10-CM | POA: Diagnosis not present

## 2024-06-04 ENCOUNTER — Encounter: Payer: Self-pay | Admitting: Family Medicine

## 2024-06-04 ENCOUNTER — Ambulatory Visit: Payer: Self-pay | Admitting: *Deleted

## 2024-06-04 ENCOUNTER — Ambulatory Visit: Admitting: Family Medicine

## 2024-06-04 VITALS — BP 150/85 | HR 78 | Ht 60.0 in | Wt 117.0 lb

## 2024-06-04 DIAGNOSIS — R319 Hematuria, unspecified: Secondary | ICD-10-CM

## 2024-06-04 DIAGNOSIS — N23 Unspecified renal colic: Secondary | ICD-10-CM

## 2024-06-04 DIAGNOSIS — R109 Unspecified abdominal pain: Secondary | ICD-10-CM

## 2024-06-04 LAB — POCT URINALYSIS DIPSTICK
Bilirubin, UA: NEGATIVE
Glucose, UA: NEGATIVE
Ketones, UA: NEGATIVE
Nitrite, UA: NEGATIVE
Protein, UA: POSITIVE — AB
Spec Grav, UA: 1.02 (ref 1.010–1.025)
Urobilinogen, UA: 2 U/dL — AB
pH, UA: 6 (ref 5.0–8.0)

## 2024-06-04 NOTE — Telephone Encounter (Signed)
 FYI Only or Action Required?: FYI only for provider.  Patient was last seen in primary care on 05/05/2024 by Gasper Nancyann BRAVO, MD.  Called Nurse Triage reporting Hematuria.  Symptoms began several days ago.  Interventions attempted: OTC medications: tylenol .  Symptoms are: gradually worsening.  Triage Disposition: See HCP Within 4 Hours (Or PCP Triage)  Patient/caregiver understands and will follow disposition?: Yes   Reason for Disposition  [1] Pain or burning with passing urine AND [2] side (flank) or back pain present  Answer Assessment - Initial Assessment Questions 1. COLOR of URINE: Describe the color of the urine.  (e.g., tea-colored, pink, red, bloody) Do you have blood clots in your urine? (e.g., none, pea, grape, small coin)     Blood when wiping- on toilet paper- twice 2. ONSET: When did the bleeding start?      Last week- Wednesday-pain 3. EPISODES: How many times has there been blood in the urine? or How many times today?     Twice- comes and goes 4. PAIN with URINATION: Is there any pain with passing your urine? If Yes, ask: How bad is the pain?  (Scale 1-10; or mild, moderate, severe)     Pain in back,- left side/groin,  with urination- pressure 5. FEVER: Do you have a fever? If Yes, ask: What is your temperature, how was it measured, and when did it start?     Not sure- not checked  7. OTHER SYMPTOMS: Do you have any other symptoms? (e.g., back/flank pain, abdomen pain, vomiting)     June- diagnosed with 2 stone- CT scan  Protocols used: Urine - Blood In-A-AH   Copied from CRM #[id number]. Topic: Clinical - Red Word Triage >> Jun 04, 2024  9:56 AM [name] wrote: [facility] that prompted transfer to Nurse Triage: Patient states she was told she had 2 kidney stones. Thought they had already passed because has had two other episodes of pain, but is having pain and some blood in her urine.

## 2024-06-04 NOTE — Progress Notes (Signed)
 Established patient visit   Patient: Jeanne Lewis   DOB: [birth year]   73 y.o. Female  MRN: [id number] Visit Date: 06/04/2024  Today's healthcare provider: Nancyann Perry, MD   Chief Complaint  Patient presents with   Flank Pain    L side pain off and on since June, she has experiencing some blood when she wipes, this has happened about 3 to 4 times since June    Subjective    Discussed the use of AI scribe software for clinical note transcription with the patient, who gave verbal consent to proceed.  History of Present Illness   Jeanne Lewis is a 73 year old female with a history of kidney stones who presents with recurrent flank pain and hematuria.  She has been experiencing recurrent flank pain, similar to previous episodes associated with kidney stones. Approximately one week ago, she had a significant bout of pain while returning from [place]  to [place] . The pain was severe enough to require medication, including homeopathic pills, Tylenol , and ibuprofen , but subsided by the next morning without emergency care.  Currently, she describes persistent mild pain from the groin to the side of her abdomen. She also reports intermittent pressure during urination and has noticed blood on toilet paper when wiping, a symptom she has experienced intermittently for some time.  Her past medical history includes a previous diagnosis of two kidney stones, one in the ureter and the other in the kidney. She underwent a cystoscopy in March.  No recent nausea or significant illness. She has experienced some urinary pressure and occasional hematuria. She attempts to manage her symptoms with increased fluid intake, including lemon water, but has not been able to tolerate cranberry juice. She is currently taking medications as needed for pain management.       Medications: Outpatient Medications Prior to Visit  Medication Sig   Acetaminophen  500 MG capsule Take 500 mg by mouth every 4  (four) hours as needed. Patient takes 2 (500mg  Caplets) in the morning and 2  (500 mg caplets) in the PM.   Ascorbic Acid (VITAMIN C) 1000 MG tablet Take 1,000 mg by mouth daily.   aspirin 81 MG tablet Take 81 mg by mouth daily as needed. Reported on 04/10/2016   Cholecalciferol (VITAMIN D-3) 1000 UNITS CAPS Take 1 capsule by mouth daily at 6 (six) AM. Reported on 04/10/2016   fluticasone (FLONASE) 50 MCG/ACT nasal spray As needed   loratadine (CLARITIN) 10 MG tablet Take 10 mg by mouth daily.   loratadine-pseudoephedrine (CLARITIN-D 24 HOUR) 10-240 MG 24 hr tablet As needed   NON FORMULARY Take 1 capsule by mouth daily. Tumeric   PEGASYS 180 MCG/ML injection INJECT 90MCG (0.5ML)  SUBCUTANEOUSLY WEEKLY (DISCARD  UNUSED MEDICATION EACH VIAL)   No facility-administered medications prior to visit.   Review of Systems     Objective    BP (!) 150/85   Pulse 78   Ht 5' (1.524 m)   Wt 117 lb (53.1 kg)   SpO2 98%   BMI 22.85 kg/m   Physical Exam   General appearance: Well developed, well nourished female, cooperative and in no acute distress Head: Normocephalic, without obvious abnormality, atraumatic Respiratory: Respirations even and unlabored, normal respiratory rate Extremities: All extremities are intact.  Skin: Skin color, texture, turgor normal. No rashes seen  Psych: Appropriate mood and affect. Neurologic: Mental status: Alert, oriented to person, place, and time, thought content appropriate.   Results for orders placed  or performed in visit on 06/04/24  POCT Urinalysis Dipstick  Result Value Ref Range   Color, UA dark yellow    Clarity, UA clear    Glucose, UA Negative Negative   Bilirubin, UA neg    Ketones, UA neg    Spec Grav, UA 1.020 1.010 - 1.025   Blood, UA trace    pH, UA 6.0 5.0 - 8.0   Protein, UA Positive (A) Negative   Urobilinogen, UA 2.0 (A) 0.2 or 1.0 E.U./dL   Nitrite, UA neg    Leukocytes, UA Trace (A) Negative   Appearance     Odor        Assessment & Plan        Nephrolithiasis with hematuria and flank pain Recurrent nephrolithiasis with hematuria and flank pain likely due to recent stone passage. Imaging showed one stone passed, another remains. Urinalysis indicated blood, no infection. Differential includes ongoing nephrolithiasis or new stone formation. - Order CT scan of kidneys to assess for remaining or new stones. - Send urine sample for microscopic examination to identify blood cells, bacteria, and stone fragments. - Discussed potential for stone analysis to guide future prevention strategies.    No follow-ups on file.     Nancyann Perry, MD  [facility] [phone] (phone) [phone] (fax)  [facility]

## 2024-06-06 LAB — URINALYSIS, MICROSCOPIC ONLY

## 2024-06-06 LAB — SPECIMEN STATUS REPORT

## 2024-06-10 DIAGNOSIS — Z1211 Encounter for screening for malignant neoplasm of colon: Secondary | ICD-10-CM | POA: Diagnosis not present

## 2024-06-11 ENCOUNTER — Ambulatory Visit
Admission: RE | Admit: 2024-06-11 | Discharge: 2024-06-11 | Disposition: A | Discharge status: Home / Self Care | Source: Ambulatory Visit | Attending: Family Medicine | Admitting: Family Medicine

## 2024-06-11 DIAGNOSIS — R319 Hematuria, unspecified: Secondary | ICD-10-CM | POA: Insufficient documentation

## 2024-06-11 DIAGNOSIS — R109 Unspecified abdominal pain: Secondary | ICD-10-CM | POA: Diagnosis not present

## 2024-06-11 DIAGNOSIS — N23 Unspecified renal colic: Secondary | ICD-10-CM | POA: Insufficient documentation

## 2024-06-11 DIAGNOSIS — N132 Hydronephrosis with renal and ureteral calculous obstruction: Secondary | ICD-10-CM | POA: Diagnosis not present

## 2024-06-11 DIAGNOSIS — K7689 Other specified diseases of liver: Secondary | ICD-10-CM | POA: Diagnosis not present

## 2024-06-11 DIAGNOSIS — N134 Hydroureter: Secondary | ICD-10-CM | POA: Diagnosis not present

## 2024-06-15 LAB — COLOGUARD: COLOGUARD: NEGATIVE

## 2024-06-16 ENCOUNTER — Ambulatory Visit: Payer: Self-pay | Admitting: Physician Assistant

## 2024-06-17 ENCOUNTER — Ambulatory Visit: Payer: Self-pay

## 2024-06-17 NOTE — Telephone Encounter (Signed)
 FYI Only or Action Required?: FYI only for provider.  Patient was last seen in primary care on 06/04/2024 by Jeanne Nancyann BRAVO, MD.  Called Nurse Triage reporting Hematuria and Abdominal Pain.  Symptoms began several months ago.  Interventions attempted: Prescription medications: Zofran  and Other: cranberry juice and lemon water.  Symptoms are: LLQ flank/groin mild to moderate pain; red blood on toilet tissue when urinating; nausea; pressure when urinating, urinary frequency stable.  Triage Disposition: See Physician Within 24 Hours  Patient/caregiver understands and will follow disposition?: Yes               Copied from CRM #[id number]. Topic: Clinical - Red Word Triage >> Jun 04, 2024  9:56 AM [name] wrote: [facility] that prompted transfer to Nurse Triage: Patient states she was told she had 2 kidney stones. Thought they had already passed because has had two other episodes of pain, but is having pain and some blood in her urine. >> Jun 17, 2024 11:12 AM [name] wrote: She still has blood in urine from her kidney stone; it is sore; she is taken pain medication 04/02/24, she went to the emergency room and notes are in chart.   Reason for Disposition  Pain or burning with passing urine  Answer Assessment - Initial Assessment Questions Patient having difficulty with timeline/specifics/assessing her pain level or describing her symptoms. Patient states she does not understand her CT results, urine results and she is concerned about still having her LLQ flank/groin pain.   1. COLOR of URINE: Describe the color of the urine.  (e.g., tea-colored, pink, red, bloody) Do you have blood clots in your urine? (e.g., none, pea, grape, small coin)     She states her urine is mostly yellow and she states when she wiped there was bright red spots on the toilet tissue. Denies any blood clots in urine.  2. ONSET: When did the bleeding start?      Since June.  3. EPISODES: How  many times has there been blood in the urine? or How many times today?     Not every day. She states a couple weeks ago she was seen and gave a urine sample; asking if a urine culture was run on that and would like to discuss her results. No note or comment left by provider on results.  4. PAIN with URINATION: Is there any pain with passing your urine? If Yes, ask: How bad is the pain?  (Scale 1-10; or mild, moderate, severe)     She states she was having pressure 3 weeks ago which she states she thought she had passed the stone. Patient states the pressure felt like when giving childbirth.  5. FEVER: Do you have a fever? If Yes, ask: What is your temperature, how was it measured, and when did it start?     No.  6. ASSOCIATED SYMPTOMS: Are you passing urine more frequently than usual?     Yes, she states since before I had that pain, states since January or February.  7. OTHER SYMPTOMS: Do you have any other symptoms? (e.g., back/flank pain, abdomen pain, vomiting)     Denies retention or weak urine stream,vomiting, fever.  LLQ abdominal pain just above the groin mild to moderate. Patient states she has been drinking cranberry juice and lemon water and zofran .  8. PREGNANCY: Is there any chance you are pregnant? When was your last menstrual period?     N/A.  Protocols used: Urine - Blood In-A-AH

## 2024-06-18 ENCOUNTER — Ambulatory Visit (INDEPENDENT_AMBULATORY_CARE_PROVIDER_SITE_OTHER): Admitting: Physician Assistant

## 2024-06-18 ENCOUNTER — Encounter: Payer: Self-pay | Admitting: Physician Assistant

## 2024-06-18 VITALS — BP 152/70 | HR 67 | Resp 14 | Ht 60.0 in | Wt 117.0 lb

## 2024-06-18 DIAGNOSIS — R1032 Left lower quadrant pain: Secondary | ICD-10-CM

## 2024-06-18 DIAGNOSIS — N200 Calculus of kidney: Secondary | ICD-10-CM

## 2024-06-18 DIAGNOSIS — I10 Essential (primary) hypertension: Secondary | ICD-10-CM

## 2024-06-18 DIAGNOSIS — R197 Diarrhea, unspecified: Secondary | ICD-10-CM | POA: Diagnosis not present

## 2024-06-18 DIAGNOSIS — K625 Hemorrhage of anus and rectum: Secondary | ICD-10-CM

## 2024-06-18 DIAGNOSIS — F4321 Adjustment disorder with depressed mood: Secondary | ICD-10-CM

## 2024-06-18 DIAGNOSIS — M549 Dorsalgia, unspecified: Secondary | ICD-10-CM

## 2024-06-18 LAB — POCT URINALYSIS DIPSTICK
Bilirubin, UA: NEGATIVE
Glucose, UA: NEGATIVE
Ketones, UA: NEGATIVE
Leukocytes, UA: NEGATIVE
Nitrite, UA: NEGATIVE
Protein, UA: POSITIVE — AB
Spec Grav, UA: 1.015 (ref 1.010–1.025)
Urobilinogen, UA: 0.2 U/dL
pH, UA: 6 (ref 5.0–8.0)

## 2024-06-18 NOTE — Progress Notes (Signed)
 Established patient visit  Patient: Jeanne Lewis   DOB: 04/27/1951   73 y.o. Female  MRN: [id number] Visit Date: 06/18/2024  Today's healthcare provider: Jolynn Spencer, PA-C   Chief Complaint  Patient presents with   Follow-up    LLQ pain, dysuria ongoing since June  Discuss CT results 06/11/24   Subjective     HPI     Follow-up    Additional comments: LLQ pain, dysuria ongoing since June  Discuss CT results 06/11/24      Last edited by Wilfred Hargis RAMAN, CMA on 06/18/2024 10:55 AM.       Discussed the use of AI scribe software for clinical note transcription with the patient, who gave verbal consent to proceed.  History of Present Illness Jeanne Lewis is a 73 year old female who presents with diarrhea and rectal bleeding.  She experiences intermittent diarrhea, often triggered by greasy or fried foods, and persists despite using lactase supplements and lactose-free milk. There is no associated nausea or vomiting. Rectal bleeding occurs when wiping, without active dripping, and follows severe diarrhea episodes. She has a history of hemorrhoids during pregnancy but denies recent issues. She is concerned about the bleeding, especially after severe diarrhea.  She has a history of kidney stones, with recent imaging showing a non-obstructive stone. She reports occasional weak stream and pressure with urination. She has a history of anemia, with previously low hemoglobin levels that normalized upon recheck. She reports hip and back pain, which worsened after traveling from [place] . She recently completed a Cologuard test with normal results.       06/18/2024   10:58 AM 06/04/2024   11:07 AM 05/05/2024    3:14 PM  Depression screen PHQ 2/9  Decreased Interest 0 0 0  Down, Depressed, Hopeless 0 0 0  PHQ - 2 Score 0 0 0  Altered sleeping 0 0 0  Tired, decreased energy 0 0 0  Change in appetite 0 0 0  Feeling bad or failure about yourself  0 0 0  Trouble concentrating 0 0 0   Moving slowly or fidgety/restless 0 0 0  Suicidal thoughts 0 0 0  PHQ-9 Score 0 0 0  Difficult doing work/chores  Not difficult at all Not difficult at all      06/18/2024   10:58 AM 06/04/2024   11:08 AM 11/04/2023    3:33 PM 11/04/2023    3:26 PM  GAD 7 : Generalized Anxiety Score  Nervous, Anxious, on Edge 0 0 0 0  Control/stop worrying 0 0 0 0  Worry too much - different things 0 0 0 0  Trouble relaxing 0 0 0 0  Restless 0 0 0 1  Easily annoyed or irritable 0 0 1 1  Afraid - awful might happen 0 0 1 0  Total GAD 7 Score 0 0 2 2  Anxiety Difficulty  Not difficult at all Not difficult at all Not difficult at all    Medications: Outpatient Medications Prior to Visit  Medication Sig   Acetaminophen  500 MG capsule Take 500 mg by mouth every 4 (four) hours as needed. Patient takes 2 (500mg  Caplets) in the morning and 2  (500 mg caplets) in the PM.   Ascorbic Acid (VITAMIN C) 1000 MG tablet Take 1,000 mg by mouth daily.   aspirin 81 MG tablet Take 81 mg by mouth daily as needed. Reported on 04/10/2016   Cholecalciferol (VITAMIN D-3) 1000 UNITS CAPS Take 1 capsule by mouth daily  at 6 (six) AM. Reported on 04/10/2016   fluticasone (FLONASE) 50 MCG/ACT nasal spray As needed   loratadine (CLARITIN) 10 MG tablet Take 10 mg by mouth daily.   loratadine-pseudoephedrine (CLARITIN-D 24 HOUR) 10-240 MG 24 hr tablet As needed   NON FORMULARY Take 1 capsule by mouth daily. Tumeric   PEGASYS 180 MCG/ML injection INJECT 90MCG (0.5ML)  SUBCUTANEOUSLY WEEKLY (DISCARD  UNUSED MEDICATION EACH VIAL)   No facility-administered medications prior to visit.    Review of Systems  All other systems reviewed and are negative.  All negative Except see HPI       Objective    BP (!) 152/70 (BP Location: Left Arm, Patient Position: Sitting, Cuff Size: Normal)   Pulse 67   Resp 14   Ht 5' (1.524 m)   Wt 117 lb (53.1 kg)   SpO2 100%   BMI 22.85 kg/m     Physical Exam Vitals reviewed.   Constitutional:      General: She is not in acute distress.    Appearance: Normal appearance. She is well-developed. She is not diaphoretic.  HENT:     Head: Normocephalic and atraumatic.  Eyes:     General: No scleral icterus.    Conjunctiva/sclera: Conjunctivae normal.  Neck:     Thyroid: No thyromegaly.  Cardiovascular:     Rate and Rhythm: Normal rate and regular rhythm.     Pulses: Normal pulses.     Heart sounds: Normal heart sounds. No murmur heard. Pulmonary:     Effort: Pulmonary effort is normal. No respiratory distress.     Breath sounds: Normal breath sounds. No wheezing, rhonchi or rales.  Musculoskeletal:     Cervical back: Neck supple.     Right lower leg: No edema.     Left lower leg: No edema.  Lymphadenopathy:     Cervical: No cervical adenopathy.  Skin:    General: Skin is warm and dry.     Findings: No rash.  Neurological:     Mental Status: She is alert and oriented to person, place, and time. Mental status is at baseline.  Psychiatric:        Mood and Affect: Mood normal.        Behavior: Behavior normal.      No results found for any visits on 06/18/24.      Assessment & Plan Gastrointestinal symptoms with rectal bleeding and diarrhea Intermittent diarrhea and rectal bleeding likely due to dietary factors. Normal Cologuard test. No diverticulitis or acute abdominal issues on CT. Symptoms improve with bland diet and hydration. - Advise to drink plenty of water and eat bland foods. - Follow up with gastroenterologist if symptoms persist or worsen.  Nonobstructive nephrolithiasis, currently asymptomatic Nonobstructive kidney stones present, asymptomatic. No obstruction or acute issues on CT. Pain likely dietary-related, not from stones. - Advise to drink plenty of water and avoid certain foods to prevent stone formation. - Follow up with urologist for dietary guidance and stone management.  Hypertension Chronic and unstable Unclear BP at  home Elevated blood pressure attributed to stress from gastrointestinal symptoms.  Back pain Intermittent back pain. No acute findings. Pain likely back-related, not kidney-related. - Advise to report any worsening of pain. Advised symptomatic treatment Will follow-up  General Health Maintenance Discussion of vaccinations and screenings. Unsure about some vaccinations. Last bone density screening in 2016. - Discuss tetanus, pneumonia, and shingles vaccinations at next visit. - Consider bone density screening, especially if not done since 2016.  Follow-Up Plan for follow-up visits and lab work discussed. Blood work for another provider will be accessible. - Schedule follow-up visit in three months. - Perform blood work including kidney function, liver function, cholesterol, and anemia screening. - Check urine sample for any abnormalities.  Ct scan of the abdomen was discussed: And pt was explained that resolution of left-sided ureteral calculus, hydronephrosis, and hydroureter was found. As well as Small 2 mm nonobstructing left-sided kidney stone.    No orders of the defined types were placed in this encounter.   No follow-ups on file.   The patient was advised to call back or seek an in-person evaluation if the symptoms worsen or if the condition fails to improve as anticipated.  I discussed the assessment and treatment plan with the patient. The patient was provided an opportunity to ask questions and all were answered. The patient agreed with the plan and demonstrated an understanding of the instructions.  I, Uziel Covault, PA-C have reviewed all documentation for this visit. The documentation on 06/18/2024  for the exam, diagnosis, procedures, and orders are all accurate and complete.  Jolynn Spencer, [facility], MMS [facility] [phone] (phone) [phone] (fax)  [facility]

## 2024-06-21 DIAGNOSIS — I10 Essential (primary) hypertension: Secondary | ICD-10-CM | POA: Insufficient documentation

## 2024-06-21 DIAGNOSIS — R197 Diarrhea, unspecified: Secondary | ICD-10-CM | POA: Insufficient documentation

## 2024-06-21 DIAGNOSIS — M549 Dorsalgia, unspecified: Secondary | ICD-10-CM | POA: Insufficient documentation

## 2024-06-21 DIAGNOSIS — N200 Calculus of kidney: Secondary | ICD-10-CM | POA: Insufficient documentation

## 2024-06-21 DIAGNOSIS — K625 Hemorrhage of anus and rectum: Secondary | ICD-10-CM | POA: Insufficient documentation

## 2024-06-22 DIAGNOSIS — R1032 Left lower quadrant pain: Secondary | ICD-10-CM | POA: Diagnosis not present

## 2024-06-23 LAB — COMPREHENSIVE METABOLIC PANEL WITH GFR
ALT: 35 IU/L — ABNORMAL HIGH (ref 0–32)
AST: 37 IU/L (ref 0–40)
Albumin: 4 g/dL (ref 3.8–4.8)
Alkaline Phosphatase: 98 IU/L (ref 44–121)
BUN/Creatinine Ratio: 36 — ABNORMAL HIGH (ref 12–28)
BUN: 16 mg/dL (ref 8–27)
Bilirubin Total: <0.2 mg/dL (ref 0.0–1.2)
CO2: 22 mmol/L (ref 20–29)
Calcium: 9.2 mg/dL (ref 8.7–10.3)
Chloride: 105 mmol/L (ref 96–106)
Creatinine, Ser: 0.45 mg/dL — ABNORMAL LOW (ref 0.57–1.00)
Globulin, Total: 2.4 g/dL (ref 1.5–4.5)
Glucose: 83 mg/dL (ref 70–99)
Potassium: 4 mmol/L (ref 3.5–5.2)
Sodium: 143 mmol/L (ref 134–144)
Total Protein: 6.4 g/dL (ref 6.0–8.5)
eGFR: 102 mL/min/1.73 (ref >59)

## 2024-06-23 LAB — CBC WITH DIFFERENTIAL/PLATELET
Basophils Absolute: 0 x10E3/uL (ref 0.0–0.2)
Basos: 1 %
EOS (ABSOLUTE): 0.1 x10E3/uL (ref 0.0–0.4)
Eos: 3 %
Hematocrit: 34.4 % (ref 34.0–46.6)
Hemoglobin: 11.6 g/dL (ref 11.1–15.9)
Immature Grans (Abs): 0 x10E3/uL (ref 0.0–0.1)
Immature Granulocytes: 0 %
Lymphocytes Absolute: 1.5 x10E3/uL (ref 0.7–3.1)
Lymphs: 36 %
MCH: 31.6 pg (ref 26.6–33.0)
MCHC: 33.7 g/dL (ref 31.5–35.7)
MCV: 94 fL (ref 79–97)
Monocytes Absolute: 0.4 x10E3/uL (ref 0.1–0.9)
Monocytes: 9 %
Neutrophils Absolute: 2.1 x10E3/uL (ref 1.4–7.0)
Neutrophils: 51 %
Platelets: 199 x10E3/uL (ref 150–450)
RBC: 3.67 x10E6/uL — ABNORMAL LOW (ref 3.77–5.28)
RDW: 12.6 % (ref 11.7–15.4)
WBC: 4 x10E3/uL (ref 3.4–10.8)

## 2024-06-23 LAB — LIPID PANEL
Chol/HDL Ratio: 5 ratio — ABNORMAL HIGH (ref 0.0–4.4)
Cholesterol, Total: 225 mg/dL — ABNORMAL HIGH (ref 100–199)
HDL: 45 mg/dL (ref >39)
LDL Chol Calc (NIH): 142 mg/dL — ABNORMAL HIGH (ref 0–99)
Triglycerides: 209 mg/dL — ABNORMAL HIGH (ref 0–149)
VLDL Cholesterol Cal: 38 mg/dL (ref 5–40)

## 2024-06-24 ENCOUNTER — Ambulatory Visit: Payer: Self-pay | Admitting: Physician Assistant

## 2024-08-04 DIAGNOSIS — H532 Diplopia: Secondary | ICD-10-CM | POA: Diagnosis not present

## 2024-08-04 DIAGNOSIS — H2512 Age-related nuclear cataract, left eye: Secondary | ICD-10-CM | POA: Diagnosis not present

## 2024-08-04 DIAGNOSIS — H2511 Age-related nuclear cataract, right eye: Secondary | ICD-10-CM | POA: Diagnosis not present

## 2024-08-10 ENCOUNTER — Ambulatory Visit: Payer: Self-pay | Admitting: Family Medicine

## 2024-08-23 DIAGNOSIS — H2511 Age-related nuclear cataract, right eye: Secondary | ICD-10-CM | POA: Diagnosis not present

## 2024-08-23 DIAGNOSIS — H2512 Age-related nuclear cataract, left eye: Secondary | ICD-10-CM | POA: Diagnosis not present

## 2024-08-23 DIAGNOSIS — H2513 Age-related nuclear cataract, bilateral: Secondary | ICD-10-CM | POA: Diagnosis not present

## 2024-08-27 ENCOUNTER — Ambulatory Visit
Admission: RE | Admit: 2024-08-27 | Discharge: 2024-08-27 | Disposition: A | Discharge status: Home / Self Care | Source: Ambulatory Visit | Attending: Physician Assistant | Admitting: Physician Assistant

## 2024-08-27 DIAGNOSIS — Z1231 Encounter for screening mammogram for malignant neoplasm of breast: Secondary | ICD-10-CM | POA: Insufficient documentation

## 2024-08-31 ENCOUNTER — Ambulatory Visit: Payer: Self-pay | Admitting: Physician Assistant

## 2024-09-02 ENCOUNTER — Encounter: Payer: Self-pay | Admitting: Ophthalmology

## 2024-09-02 NOTE — Anesthesia Preprocedure Evaluation (Addendum)
 Anesthesia Evaluation  Patient identified by MRN, date of birth, ID band Patient awake    Reviewed: Allergy & Precautions, H&P , NPO status , Patient's Chart, lab work & pertinent test results  Airway Mallampati: III  TM Distance: <3 FB    Comment: Very short TMD, would likely be anterior airway if intubation were needed Dental  (+) Caps, Implants No caps or implant in front teeth:   Pulmonary neg pulmonary ROS          Cardiovascular hypertension, negative cardio ROS + dysrhythmias      Neuro/Psych negative neurological ROS  negative psych ROS   GI/Hepatic negative GI ROS, Neg liver ROS,GERD  ,,  Endo/Other  negative endocrine ROS    Renal/GU Renal diseasenegative Renal ROS  negative genitourinary   Musculoskeletal negative musculoskeletal ROS (+) Arthritis ,    Abdominal   Peds negative pediatric ROS (+)  Hematology negative hematology ROS (+)   Anesthesia Other Findings Cutaneous T cell lymphoma Arthritis Raynaud disease Seasonal allergies   Reproductive/Obstetrics negative OB ROS                              Anesthesia Physical Anesthesia Plan  ASA: 3  Anesthesia Plan: MAC   Post-op Pain Management:    Induction: Intravenous  PONV Risk Score and Plan:   Airway Management Planned: Natural Airway and Nasal Cannula  Additional Equipment:   Intra-op Plan:   Post-operative Plan:   Informed Consent: I have reviewed the patients History and Physical, chart, labs and discussed the procedure including the risks, benefits and alternatives for the proposed anesthesia with the patient or authorized representative who has indicated his/her understanding and acceptance.     Dental Advisory Given  Plan Discussed with: Anesthesiologist, CRNA and Surgeon  Anesthesia Plan Comments: (Patient consented for risks of anesthesia including but not limited to:  - adverse reactions to  medications - damage to eyes, teeth, lips or other oral mucosa - nerve damage due to positioning  - sore throat or hoarseness - Damage to heart, brain, nerves, lungs, other parts of body or loss of life  Patient voiced understanding and assent.)         Anesthesia Quick Evaluation

## 2024-09-03 NOTE — Discharge Instructions (Signed)

## 2024-09-06 ENCOUNTER — Encounter: Payer: Self-pay | Admitting: Ophthalmology

## 2024-09-07 ENCOUNTER — Ambulatory Visit: Payer: Self-pay | Admitting: Anesthesiology

## 2024-09-07 ENCOUNTER — Encounter: Payer: Self-pay | Admitting: Ophthalmology

## 2024-09-07 ENCOUNTER — Other Ambulatory Visit: Payer: Self-pay

## 2024-09-07 ENCOUNTER — Encounter
Admission: RE | Disposition: A | Discharge status: Home / Self Care | Payer: Self-pay | Source: Home / Self Care | Attending: Ophthalmology

## 2024-09-07 ENCOUNTER — Ambulatory Visit
Admission: RE | Admit: 2024-09-07 | Discharge: 2024-09-07 | Disposition: A | Discharge status: Home / Self Care | Attending: Ophthalmology | Admitting: Ophthalmology

## 2024-09-07 DIAGNOSIS — I10 Essential (primary) hypertension: Secondary | ICD-10-CM | POA: Diagnosis not present

## 2024-09-07 DIAGNOSIS — Z79899 Other long term (current) drug therapy: Secondary | ICD-10-CM | POA: Insufficient documentation

## 2024-09-07 DIAGNOSIS — H2512 Age-related nuclear cataract, left eye: Secondary | ICD-10-CM | POA: Diagnosis not present

## 2024-09-07 HISTORY — DX: Personal history of urinary calculi: Z87.442

## 2024-09-07 HISTORY — DX: Raynaud's syndrome without gangrene: I73.00

## 2024-09-07 HISTORY — DX: Essential (primary) hypertension: I10

## 2024-09-07 HISTORY — DX: Fatty (change of) liver, not elsewhere classified: K76.0

## 2024-09-07 HISTORY — DX: Cardiac arrhythmia, unspecified: I49.9

## 2024-09-07 HISTORY — DX: Gastro-esophageal reflux disease without esophagitis: K21.9

## 2024-09-07 HISTORY — PX: CATARACT EXTRACTION W/PHACO: SHX586

## 2024-09-07 HISTORY — DX: Other seasonal allergic rhinitis: J30.2

## 2024-09-07 SURGERY — PHACOEMULSIFICATION, CATARACT, WITH IOL INSERTION
Anesthesia: Monitor Anesthesia Care | Laterality: Left

## 2024-09-07 MED ORDER — TETRACAINE HCL 0.5 % OP SOLN
OPHTHALMIC | Status: AC
Start: 2024-09-07 — End: 2024-09-07
  Filled 2024-09-07: qty 4

## 2024-09-07 MED ORDER — CYCLOPENTOLATE HCL 2 % OP SOLN
1.0000 [drp] | OPHTHALMIC | Status: AC
  Administered 2024-09-07 (×2): 1 [drp] via OPHTHALMIC

## 2024-09-07 MED ORDER — PHENYLEPHRINE HCL 10 % OP SOLN
OPHTHALMIC | Status: AC
Start: 2024-09-07 — End: 2024-09-07
  Filled 2024-09-07: qty 5

## 2024-09-07 MED ORDER — TETRACAINE HCL 0.5 % OP SOLN
1.0000 [drp] | OPHTHALMIC | Status: DC | PRN
  Administered 2024-09-07 (×3): 1 [drp] via OPHTHALMIC

## 2024-09-07 MED ORDER — MIDAZOLAM HCL (PF) 2 MG/2ML IJ SOLN
INTRAMUSCULAR | Status: DC | PRN
  Administered 2024-09-07: 2 mg via INTRAVENOUS

## 2024-09-07 MED ORDER — SIGHTPATH DOSE#1 BSS IO SOLN
INTRAOCULAR | Status: DC | PRN
Start: 2024-09-07 — End: 2024-09-07
  Administered 2024-09-07: 15 mL via INTRAOCULAR

## 2024-09-07 MED ORDER — LACTATED RINGERS IV SOLN: INTRAVENOUS | Status: DC

## 2024-09-07 MED ORDER — CYCLOPENTOLATE HCL 2 % OP SOLN
OPHTHALMIC | Status: AC
Start: 2024-09-07 — End: 2024-09-07
  Filled 2024-09-07: qty 2

## 2024-09-07 MED ORDER — PHENYLEPHRINE HCL 10 % OP SOLN
1.0000 [drp] | OPHTHALMIC | Status: AC
  Administered 2024-09-07 (×3): 1 [drp] via OPHTHALMIC

## 2024-09-07 MED ORDER — SIGHTPATH DOSE#1 NA CHONDROIT SULF-NA HYALURON 40-17 MG/ML IO SOLN
INTRAOCULAR | Status: DC | PRN
Start: 2024-09-07 — End: 2024-09-07
  Administered 2024-09-07: 1 mL via INTRAOCULAR

## 2024-09-07 MED ORDER — FENTANYL CITRATE (PF) 100 MCG/2ML IJ SOLN
INTRAMUSCULAR | Status: DC | PRN
  Administered 2024-09-07: 50 ug via INTRAVENOUS

## 2024-09-07 MED ORDER — MIDAZOLAM HCL 2 MG/2ML IJ SOLN
INTRAMUSCULAR | Status: AC
  Filled 2024-09-07: qty 2

## 2024-09-07 MED ORDER — MOXIFLOXACIN HCL 0.5 % OP SOLN
OPHTHALMIC | Status: DC | PRN
  Administered 2024-09-07: .2 mL via OPHTHALMIC

## 2024-09-07 MED ORDER — SIGHTPATH DOSE#1 BSS IO SOLN
INTRAOCULAR | Status: DC | PRN
  Administered 2024-09-07: 37 mL via OPHTHALMIC

## 2024-09-07 MED ORDER — BRIMONIDINE TARTRATE-TIMOLOL 0.2-0.5 % OP SOLN
OPHTHALMIC | Status: DC | PRN
  Administered 2024-09-07: 1 [drp] via OPHTHALMIC

## 2024-09-07 MED ORDER — BALANCED SALT IO SOLN
INTRAOCULAR | Status: DC | PRN
  Administered 2024-09-07: 2 mL

## 2024-09-07 MED ORDER — FENTANYL CITRATE (PF) 100 MCG/2ML IJ SOLN
INTRAMUSCULAR | Status: AC
  Filled 2024-09-07: qty 2

## 2024-09-07 SURGICAL SUPPLY — 10 items
CANNULA ANT/CHMB 27G (MISCELLANEOUS) ×1 IMPLANT
CYSTOTOME ANGL RVRS SHRT 25G (CUTTER) ×1 IMPLANT
FEE CATARACT SUITE SIGHTPATH (MISCELLANEOUS) ×1 IMPLANT
GLOVE BIOGEL PI IND STRL 8 (GLOVE) ×1 IMPLANT
GLOVE SURG LX STRL 8.0 MICRO (GLOVE) ×1 IMPLANT
GLOVE SURG SYN 6.5 PF PI BL (GLOVE) ×1 IMPLANT
LENS IOL TECNIS EYHANCE 20.5 (Intraocular Lens) IMPLANT
NDL FILTER BLUNT 18X1 1/2 (NEEDLE) ×1 IMPLANT
NEEDLE FILTER BLUNT 18X1 1/2 (NEEDLE) ×1 IMPLANT
SYR 3ML LL SCALE MARK (SYRINGE) ×1 IMPLANT

## 2024-09-07 NOTE — Anesthesia Postprocedure Evaluation (Signed)
 Anesthesia Post Note  Patient: Jeanne Lewis  Procedure(s) Performed: PHACOEMULSIFICATION, CATARACT, WITH IOL INSERTION 7.56 00:47.4 (Left)  Patient location during evaluation: PACU Anesthesia Type: MAC Level of consciousness: awake and alert Pain management: pain level controlled Vital Signs Assessment: post-procedure vital signs reviewed and stable Respiratory status: spontaneous breathing, nonlabored ventilation, respiratory function stable and patient connected to nasal cannula oxygen Cardiovascular status: stable and blood pressure returned to baseline Postop Assessment: no apparent nausea or vomiting Anesthetic complications: no   No notable events documented.   Last Vitals:  Vitals:   09/07/24 0748 09/07/24 0751  BP:  137/78  Pulse: 67 68  Resp: 15 12  Temp:    SpO2: 95% 93%    Last Pain:  Vitals:   09/07/24 0751  TempSrc:   PainSc: 0-No pain                 [name]

## 2024-09-07 NOTE — H&P (Signed)
 [facility]   Primary Care Physician:  Ostwalt, Janna, PA-C Ophthalmologist: Dr. Elsie Carmine  Pre-Procedure History & Physical: HPI:  Jeanne Lewis is a 73 y.o. female here for cataract surgery.   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 Authorizing Provider  Acetaminophen  500 MG capsule Take 500 mg by mouth every 4 (four) hours as needed. Patient takes 2 (500mg  Caplets) in the morning and 2  (500 mg caplets) in the PM.   Yes [provider]  ibuprofen  (ADVIL ) 200 MG tablet Take 200 mg by mouth every 6 (six) hours as needed.   Yes [provider]  Liniments (DEEP BLUE RELIEF EX) Apply 1 Application topically daily.   Yes [provider]  loratadine (CLARITIN) 10 MG tablet Take 10 mg by mouth daily.   Yes [provider]  Multiple Vitamins-Minerals (MULTIVITAMIN WITH MINERALS) tablet Take 1 tablet by mouth daily.   Yes [provider]  NON FORMULARY Take 1 capsule by mouth daily. Tumeric   Yes [provider]  NON FORMULARY TERRAZINE   Yes [provider]  NON FORMULARY TRIEASE   Yes [provider]  NON FORMULARY ON GUARD   Yes [provider]  NON FORMULARY PB RESTORE   Yes [provider]  PEGASYS 180 MCG/ML injection INJECT 90MCG (0.5ML)  SUBCUTANEOUSLY WEEKLY (DISCARD  UNUSED MEDICATION EACH VIAL) 07/11/23  Yes [provider]  PROBIOTIC, LACTOBACILLUS, PO Take 1 tablet by mouth daily.   Yes [provider]    Allergies as of 08/06/2024 - Review Complete 06/18/2024  Allergen Reaction Noted   Augmentin [amoxicillin-pot clavulanate] Itching and Swelling 12/04/2023   Morphine and codeine Other (See Comments) 02/04/2015   Neosporin [neomycin-bacitracin zn-polymyx] Itching 02/04/2015   Polysporin [bacitracin-polymyxin b] Itching 02/04/2015    Family History  Problem Relation Age of Onset   Macular degeneration Mother    Breast cancer Maternal Aunt 78   Cancer Paternal Uncle     Social History   Socioeconomic History   Marital status: Widowed    Spouse name: Not on file   Number of children: Not on file   Years of education: Not on file   Highest education level: Not on file  Occupational History   Not on file  Tobacco Use   Smoking status: Never    Passive exposure: Past   Smokeless tobacco: Never  Vaping Use   Vaping status: Never Used  Substance and Sexual Activity   Alcohol use: No   Drug use: Never   Sexual activity: Not Currently  Other Topics Concern   Not on file  Social History Narrative   Not on file   Social Drivers of Health   Financial Resource Strain: Low Risk  (05/05/2024)   Overall Financial Resource Strain (CARDIA)    Difficulty of Paying Living Expenses: Not hard at all  Food Insecurity: No Food  Insecurity (05/05/2024)   Hunger Vital Sign    Worried About Running Out of Food in the Last Year: Never true    Ran Out of Food in the Last Year: Never true  Transportation Needs: No Transportation Needs (05/05/2024)   PRAPARE - Administrator, Civil Service (Medical): No    Lack of Transportation (Non-Medical): No  Physical Activity: Insufficiently Active (05/05/2024)   Exercise Vital Sign    Days of Exercise per Week: 2 days    Minutes of Exercise per Session: 30 min  Stress: No Stress Concern Present (05/05/2024)   [employer] of Occupational Health - Occupational Stress Questionnaire    Feeling of Stress: Only a little   Social Connections: Moderately Isolated (05/05/2024)   Social Connection and Isolation Panel    Frequency of Communication with Friends and Family: More than three times a week    Frequency of Social Gatherings with Friends and Family: Never    Attends Religious Services: More than 4 times per year    Active Member of [employer] or Organizations: No    Attends Banker Meetings: Never    Marital Status: Widowed  Intimate Partner Violence: Not At Risk (05/05/2024)   Humiliation, Afraid, Rape, and Kick questionnaire    Fear of Current or Ex-Partner: No    Emotionally Abused: No    Physically Abused: No    Sexually Abused: No    Review of Systems: See HPI, otherwise negative ROS  Physical Exam: BP (!) 170/90   Temp 97.7 F (36.5 C) (Temporal)   Resp 16   Ht 5' (1.524 m)   Wt 55.7 kg   SpO2 98%   BMI 23.96 kg/m  General:   Alert, cooperative. Head:  Normocephalic and atraumatic. Respiratory:  Normal work of breathing. Cardiovascular:  NAD  Impression/Plan: Jeanne Lewis is here for cataract surgery.  Risks, benefits, limitations, and alternatives regarding cataract surgery have been reviewed with the patient.  Questions have been answered.  All parties agreeable.   Elsie Carmine, MD  09/07/2024, 7:18 AM

## 2024-09-07 NOTE — Op Note (Signed)
 PREOPERATIVE DIAGNOSIS:  Nuclear sclerotic cataract of the left eye.   POSTOPERATIVE DIAGNOSIS:  Nuclear sclerotic cataract of the left eye.   OPERATIVE PROCEDURE:ORPROCALL@   SURGEON:  Elsie Carmine, MD.   ANESTHESIA:  Anesthesiologist: Ola Donny BROCKS, MD CRNA: Myra Lawless, CRNA  1.      Managed anesthesia care. 2.     0.21ml of Shugarcaine was instilled following the paracentesis   COMPLICATIONS:  None.   TECHNIQUE:   Stop and chop   DESCRIPTION OF PROCEDURE:  The patient was examined and consented in the preoperative holding area where the aforementioned topical anesthesia was applied to the left eye and then brought back to the Operating Room where the left eye was prepped and draped in the usual sterile ophthalmic fashion and a lid speculum was placed. A paracentesis was created with the side port blade and the anterior chamber was filled with viscoelastic. A near clear corneal incision was performed with the steel keratome. A continuous curvilinear capsulorrhexis was performed with a cystotome followed by the capsulorrhexis forceps. Hydrodissection and hydrodelineation were carried out with BSS on a blunt cannula. The lens was removed in a stop and chop  technique and the remaining cortical material was removed with the irrigation-aspiration handpiece. The capsular bag was inflated with viscoelastic and the intraocular lens was placed in the capsular bag without complication. The remaining viscoelastic was removed from the eye with the irrigation-aspiration handpiece. The wounds were hydrated. The anterior chamber was flushed with BSS and the eye was inflated to physiologic pressure. 0.74ml Vigamox was placed in the anterior chamber. The wounds were found to be water tight. The eye was dressed with Combigan. The patient was given protective glasses to wear throughout the day and a shield with which to sleep tonight. The patient was also given drops with which to begin a drop regimen  today and will follow-up with me in one day. Implant Name Type Inv. Item Serial No. Manufacturer Lot No. LRB No. Used Action  LENS IOL TECNIS EYHANCE 20.5 - [id number] Intraocular Lens LENS IOL TECNIS EYHANCE 20.5 [id number] [employer]  Left 1 Implanted    Procedure(s): PHACOEMULSIFICATION, CATARACT, WITH IOL INSERTION 7.56 00:47.4 (Left)  Electronically signed: Elsie Carmine 09/07/2024 7:44 AM

## 2024-09-07 NOTE — Transfer of Care (Signed)
 Immediate Anesthesia Transfer of Care Note  Patient: Jeanne Lewis  Procedure(s) Performed: PHACOEMULSIFICATION, CATARACT, WITH IOL INSERTION 7.56 00:47.4 (Left)  Patient Location: PACU  Anesthesia Type: MAC  Level of Consciousness: awake, alert  and patient cooperative  Airway and Oxygen Therapy: Patient Spontanous Breathing and Patient connected to supplemental oxygen  Post-op Assessment: Post-op Vital signs reviewed, Patient's Cardiovascular Status Stable, Respiratory Function Stable, Patent Airway and No signs of Nausea or vomiting  Post-op Vital Signs: Reviewed and stable  Complications: No notable events documented.

## 2024-09-08 DIAGNOSIS — H2511 Age-related nuclear cataract, right eye: Secondary | ICD-10-CM | POA: Diagnosis not present

## 2024-09-21 NOTE — Discharge Instructions (Signed)

## 2024-09-28 ENCOUNTER — Encounter: Payer: Self-pay | Admitting: Ophthalmology

## 2024-09-28 ENCOUNTER — Other Ambulatory Visit: Payer: Self-pay

## 2024-09-28 ENCOUNTER — Encounter: Admission: RE | Disposition: A | Payer: Self-pay | Source: Home / Self Care | Attending: Ophthalmology

## 2024-09-28 ENCOUNTER — Ambulatory Visit: Admitting: Anesthesiology

## 2024-09-28 ENCOUNTER — Ambulatory Visit
Admission: RE | Admit: 2024-09-28 | Discharge: 2024-09-28 | Disposition: A | Discharge status: Home / Self Care | Attending: Ophthalmology | Admitting: Ophthalmology

## 2024-09-28 DIAGNOSIS — Z7722 Contact with and (suspected) exposure to environmental tobacco smoke (acute) (chronic): Secondary | ICD-10-CM | POA: Diagnosis not present

## 2024-09-28 DIAGNOSIS — I10 Essential (primary) hypertension: Secondary | ICD-10-CM | POA: Diagnosis not present

## 2024-09-28 DIAGNOSIS — I499 Cardiac arrhythmia, unspecified: Secondary | ICD-10-CM | POA: Diagnosis not present

## 2024-09-28 DIAGNOSIS — E7849 Other hyperlipidemia: Secondary | ICD-10-CM | POA: Diagnosis not present

## 2024-09-28 DIAGNOSIS — H2511 Age-related nuclear cataract, right eye: Secondary | ICD-10-CM | POA: Diagnosis not present

## 2024-09-28 DIAGNOSIS — K219 Gastro-esophageal reflux disease without esophagitis: Secondary | ICD-10-CM | POA: Diagnosis not present

## 2024-09-28 DIAGNOSIS — Z21 Asymptomatic human immunodeficiency virus [HIV] infection status: Secondary | ICD-10-CM | POA: Diagnosis not present

## 2024-09-28 HISTORY — PX: CATARACT EXTRACTION W/PHACO: SHX586

## 2024-09-28 SURGERY — PHACOEMULSIFICATION, CATARACT, WITH IOL INSERTION
Anesthesia: Topical | Laterality: Right

## 2024-09-28 MED ORDER — SIGHTPATH DOSE#1 NA CHONDROIT SULF-NA HYALURON 40-17 MG/ML IO SOLN
INTRAOCULAR | Status: DC | PRN
  Administered 2024-09-28: 1 mL via INTRAOCULAR

## 2024-09-28 MED ORDER — FENTANYL CITRATE (PF) 100 MCG/2ML IJ SOLN
INTRAMUSCULAR | Status: DC | PRN
  Administered 2024-09-28 (×2): 50 ug via INTRAVENOUS

## 2024-09-28 MED ORDER — LIDOCAINE HCL (PF) 2 % IJ SOLN
INTRAOCULAR | Status: DC | PRN
  Administered 2024-09-28: 2 mL

## 2024-09-28 MED ORDER — CYCLOPENTOLATE HCL 2 % OP SOLN
1.0000 [drp] | OPHTHALMIC | Status: AC
  Administered 2024-09-28 (×3): 1 [drp] via OPHTHALMIC

## 2024-09-28 MED ORDER — PHENYLEPHRINE HCL 10 % OP SOLN
OPHTHALMIC | Status: AC
  Filled 2024-09-28: qty 5

## 2024-09-28 MED ORDER — SIGHTPATH DOSE#1 BSS IO SOLN
INTRAOCULAR | Status: DC | PRN
  Administered 2024-09-28: 15 mL via INTRAOCULAR

## 2024-09-28 MED ORDER — SIGHTPATH DOSE#1 BSS IO SOLN
INTRAOCULAR | Status: DC | PRN
  Administered 2024-09-28: 33 mL via OPHTHALMIC

## 2024-09-28 MED ORDER — TETRACAINE HCL 0.5 % OP SOLN
1.0000 [drp] | OPHTHALMIC | Status: DC | PRN
  Administered 2024-09-28 (×3): 1 [drp] via OPHTHALMIC

## 2024-09-28 MED ORDER — MIDAZOLAM HCL 5 MG/5ML IJ SOLN
INTRAMUSCULAR | Status: DC | PRN
  Administered 2024-09-28: 2 mg via INTRAVENOUS

## 2024-09-28 MED ORDER — CYCLOPENTOLATE HCL 2 % OP SOLN
OPHTHALMIC | Status: AC
  Filled 2024-09-28: qty 2

## 2024-09-28 MED ORDER — BRIMONIDINE TARTRATE-TIMOLOL 0.2-0.5 % OP SOLN
OPHTHALMIC | Status: DC | PRN
  Administered 2024-09-28: 1 [drp] via OPHTHALMIC

## 2024-09-28 MED ORDER — TETRACAINE HCL 0.5 % OP SOLN
OPHTHALMIC | Status: AC
  Filled 2024-09-28: qty 4

## 2024-09-28 MED ORDER — FENTANYL CITRATE (PF) 100 MCG/2ML IJ SOLN
INTRAMUSCULAR | Status: AC
  Filled 2024-09-28: qty 2

## 2024-09-28 MED ORDER — MIDAZOLAM HCL 2 MG/2ML IJ SOLN
INTRAMUSCULAR | Status: AC
  Filled 2024-09-28: qty 2

## 2024-09-28 MED ORDER — PHENYLEPHRINE HCL 10 % OP SOLN
1.0000 [drp] | OPHTHALMIC | Status: AC
  Administered 2024-09-28 (×3): 1 [drp] via OPHTHALMIC

## 2024-09-28 SURGICAL SUPPLY — 9 items
CANNULA ANT/CHMB 27G (MISCELLANEOUS) ×1 IMPLANT
CYSTOTOME ANGL RVRS SHRT 25G (CUTTER) ×1 IMPLANT
FEE CATARACT SUITE SIGHTPATH (MISCELLANEOUS) ×1 IMPLANT
GLOVE BIOGEL PI IND STRL 8 (GLOVE) ×1 IMPLANT
GLOVE SURG LX STRL 8.0 MICRO (GLOVE) ×1 IMPLANT
GLOVE SURG SYN 6.5 PF PI BL (GLOVE) ×1 IMPLANT
LENS IOL TECNIS EYHANCE 20.5 (Intraocular Lens) IMPLANT
NDL FILTER BLUNT 18X1 1/2 (NEEDLE) ×1 IMPLANT
SYR 3ML LL SCALE MARK (SYRINGE) ×1 IMPLANT

## 2024-09-28 NOTE — Anesthesia Preprocedure Evaluation (Addendum)
 Anesthesia Evaluation  Patient identified by MRN, date of birth, ID band Patient awake    Reviewed: Allergy & Precautions, H&P , NPO status , Patient's Chart, lab work & pertinent test results  Airway Mallampati: III  TM Distance: <3 FB    Comment: Very short TMD, would likely be anterior airway if intubation were needed Dental  (+) Caps, Implants No caps or implant in front teeth:   Pulmonary neg pulmonary ROS          Cardiovascular hypertension, + dysrhythmias      Neuro/Psych negative neurological ROS  negative psych ROS   GI/Hepatic Neg liver ROS,GERD  ,,  Endo/Other  negative endocrine ROS    Renal/GU Renal disease  negative genitourinary   Musculoskeletal  (+) Arthritis ,    Abdominal   Peds negative pediatric ROS (+)  Hematology  (+) HIV  Anesthesia Other Findings URI resolving. No significant fever/cough/SOB. She reports memory challenges recently. 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 Seasonal allergies   Reproductive/Obstetrics negative OB ROS                              Anesthesia Physical Anesthesia Plan  ASA: 2  Anesthesia Plan: MAC   Post-op Pain Management:    Induction: Intravenous  PONV Risk Score and Plan:   Airway Management Planned: Natural Airway and Nasal Cannula  Additional Equipment:   Intra-op Plan:   Post-operative Plan:   Informed Consent: I have reviewed the patients History and Physical, chart, labs and discussed the procedure including the risks, benefits and alternatives for the proposed anesthesia with the patient or authorized representative who has indicated his/her understanding and acceptance.     Dental Advisory Given  Plan Discussed with: Anesthesiologist, CRNA and Surgeon  Anesthesia Plan Comments:          Anesthesia Quick Evaluation

## 2024-09-28 NOTE — Transfer of Care (Signed)
 Immediate Anesthesia Transfer of Care Note  Patient: Jeanne Lewis  Procedure(s) Performed: PHACOEMULSIFICATION, CATARACT, WITH IOL INSERTION 5.39 00:35.5 (Right)  Patient Location: PACU  Anesthesia Type: MAC  Level of Consciousness: awake, alert  and patient cooperative  Airway and Oxygen Therapy: Patient Spontanous Breathing and Patient connected to supplemental oxygen  Post-op Assessment: Post-op Vital signs reviewed, Patient's Cardiovascular Status Stable, Respiratory Function Stable, Patent Airway and No signs of Nausea or vomiting  Post-op Vital Signs: Reviewed and stable  Complications: No notable events documented.

## 2024-09-28 NOTE — H&P (Signed)
 [facility]   Primary Care Physician:  Ostwalt, Janna, PA-C Ophthalmologist: Dr. Elsie Carmine  Pre-Procedure History & Physical: HPI:  Jeanne Lewis is a 73 y.o. female here for cataract surgery.   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   Dysrhythmia    PALPITATIONS WITH CAFFEINE   Fatty liver    GERD (gastroesophageal reflux disease)    EOE takes supplements   History of kidney stones    Hypertension    borderline no meds   Raynaud disease    Seasonal allergies     Past Surgical History:  Procedure Laterality Date   CATARACT EXTRACTION W/PHACO Left 09/07/2024   Procedure: PHACOEMULSIFICATION, CATARACT, WITH IOL INSERTION 7.56 00:47.4;  Surgeon: Carmine Elsie, MD;  Location: [facility];  Service: Ophthalmology;  Laterality: Left;   WISDOM TOOTH EXTRACTION      Prior to Admission medications   Medication Sig Start Date End Date Taking? Authorizing Provider  ascorbic acid (VITAMIN C) 250 MG CHEW Chew 1,000 mg by mouth daily.   Yes [provider]  Acetaminophen  500 MG capsule Take 500 mg by mouth every 4 (four) hours as needed. Patient takes 2 (500mg  Caplets) in the morning and 2  (500 mg caplets) in the PM.    [provider]  ibuprofen  (ADVIL ) 200 MG tablet Take 200 mg by mouth every 6 (six) hours as needed.    [provider]  Liniments (DEEP BLUE RELIEF EX) Apply 1 Application topically daily.    [provider]  loratadine (CLARITIN) 10 MG tablet Take 10 mg by mouth daily.    [provider]  Multiple Vitamins-Minerals (MULTIVITAMIN WITH MINERALS) tablet Take 1 tablet by mouth daily.    [provider]  NON FORMULARY Take 1 capsule by mouth daily. Tumeric    [provider]  NON FORMULARY TERRAZINE    [provider]  NON FORMULARY TRIEASE    [provider]  NON FORMULARY ON GUARD     [provider]  NON FORMULARY PB RESTORE    [provider]  PEGASYS 180 MCG/ML injection INJECT (0.5ML)  SUBCUTANEOUSLY WEEKLY (DISCARD  UNUSED MEDICATION EACH VIAL) 07/11/23   [provider]  PROBIOTIC, LACTOBACILLUS, PO Take 1 tablet by mouth daily.    [provider]    Allergies as of 08/06/2024 - Review Complete 06/18/2024  Allergen Reaction Noted   Augmentin [amoxicillin-pot clavulanate] Itching and Swelling 12/04/2023   Morphine and codeine Other (See Comments) 02/04/2015   Neosporin [neomycin-bacitracin zn-polymyx] Itching 02/04/2015   Polysporin [bacitracin-polymyxin b] Itching 02/04/2015    Family History  Problem Relation Age of Onset   Macular degeneration Mother    Breast cancer Maternal Aunt 49   Cancer Paternal Uncle     Social History   Socioeconomic History   Marital status: Widowed    Spouse name: Not on file   Number of children: Not on file   Years of education: Not on file   Highest education level: Not on file  Occupational History   Not on file  Tobacco Use   Smoking status: Never    Passive exposure: Past   Smokeless tobacco: Never  Vaping Use   Vaping status: Never Used  Substance and Sexual Activity   Alcohol use: No   Drug use: Never   Sexual activity: Not Currently  Other Topics Concern  Not on file  Social History Narrative   Not on file   Social Drivers of Health   Financial Resource Strain: Low Risk  (05/05/2024)   Overall Financial Resource Strain (CARDIA)    Difficulty of Paying Living Expenses: Not hard at all  Food Insecurity: No Food Insecurity (05/05/2024)   Hunger Vital Sign    Worried About Running Out of Food in the Last Year: Never true    Ran Out of Food in the Last Year: Never true  Transportation Needs: No Transportation Needs (05/05/2024)   PRAPARE - Administrator, Civil Service (Medical): No    Lack of Transportation (Non-Medical): No  Physical Activity:  Insufficiently Active (05/05/2024)   Exercise Vital Sign    Days of Exercise per Week: 2 days    Minutes of Exercise per Session: 30 min  Stress: No Stress Concern Present (05/05/2024)   [employer] of Occupational Health - Occupational Stress Questionnaire    Feeling of Stress: Only a little  Social Connections: Moderately Isolated (05/05/2024)   Social Connection and Isolation Panel    Frequency of Communication with Friends and Family: More than three times a week    Frequency of Social Gatherings with Friends and Family: Never    Attends Religious Services: More than 4 times per year    Active Member of [employer] or Organizations: No    Attends Banker Meetings: Never    Marital Status: Widowed  Intimate Partner Violence: Not At Risk (05/05/2024)   Humiliation, Afraid, Rape, and Kick questionnaire    Fear of Current or Ex-Partner: No    Emotionally Abused: No    Physically Abused: No    Sexually Abused: No    Review of Systems: See HPI, otherwise negative ROS  Physical Exam: BP (!) 155/91   Temp 99.4 F (37.4 C) (Temporal)   Resp (!) 21   Ht 5' (1.524 m)   Wt 56.2 kg   SpO2 97%   BMI 24.20 kg/m  General:   Alert, cooperative. Head:  Normocephalic and atraumatic. Respiratory:  Normal work of breathing. Cardiovascular:  NAD  Impression/Plan: Jeanne Lewis is here for cataract surgery.  Risks, benefits, limitations, and alternatives regarding cataract surgery have been reviewed with the patient.  Questions have been answered.  All parties agreeable.   Elsie Carmine, MD  09/28/2024, 9:48 AM

## 2024-09-28 NOTE — Anesthesia Postprocedure Evaluation (Signed)
 Anesthesia Post Note  Patient: Jeanne Lewis  Procedure(s) Performed: PHACOEMULSIFICATION, CATARACT, WITH IOL INSERTION 5.39 00:35.5 (Right)  Patient location during evaluation: PACU Anesthesia Type: MAC Level of consciousness: awake and alert Pain management: pain level controlled Vital Signs Assessment: post-procedure vital signs reviewed and stable Respiratory status: spontaneous breathing, nonlabored ventilation and respiratory function stable Cardiovascular status: stable and blood pressure returned to baseline Postop Assessment: no apparent nausea or vomiting Anesthetic complications: no   No notable events documented.   Last Vitals:  Vitals:   09/28/24 1012 09/28/24 1017  BP: (!) 149/88 (!) 139/90  Pulse: 85 73  Resp: 20 20  Temp: 36.7 C 36.7 C  SpO2: 100% 100%    Last Pain:  Vitals:   09/28/24 1017  TempSrc:   PainSc: 0-No pain                 Camellia Merilee Louder

## 2024-09-28 NOTE — Op Note (Signed)
 PREOPERATIVE DIAGNOSIS:  Nuclear sclerotic cataract of the right eye.   POSTOPERATIVE DIAGNOSIS:ORPOSTDX@   OPERATIVE PROCEDURE:  Procedure(s): PHACOEMULSIFICATION, CATARACT, WITH IOL INSERTION 5.39 00:35.5   SURGEON:  Elsie Carmine, MD.   ANESTHESIA:  Anesthesiologist: Vicci Camellia Glatter, MD CRNA: Niki Manus SAUNDERS, CRNA  1.      Managed anesthesia care. 2.      Topical tetracaine  drops followed by 2% Xylocaine  jelly applied in the preoperative holding area.   COMPLICATIONS:  None.   TECHNIQUE:   Stop and chop   DESCRIPTION OF PROCEDURE:  The patient was examined and consented in the preoperative holding area where the aforementioned topical anesthesia was applied to the right eye and then brought back to the Operating Room where the right eye was prepped and draped in the usual sterile ophthalmic fashion and a lid speculum was placed. A paracentesis was created with the side port blade and the anterior chamber was filled with viscoelastic. A near clear corneal incision was performed with the steel keratome. A continuous curvilinear capsulorrhexis was performed with a cystotome followed by the capsulorrhexis forceps. Hydrodissection and hydrodelineation were carried out with BSS on a blunt cannula. The lens was removed in a stop and chop  technique and the remaining cortical material was removed with the irrigation-aspiration handpiece. The capsular bag was inflated with viscoelastic and the Technis ZCB00  lens was placed in the capsular bag without complication. The remaining viscoelastic was removed from the eye with the irrigation-aspiration handpiece. The wounds were hydrated. The anterior chamber was flushed with BSS and the eye was inflated to physiologic pressure. 0.1 mL of cefuroxime concentration 10 mg/mL was placed in the anterior chamber. The wounds were found to be water tight. The eye was dressed with Combigan . The patient was given protective glasses to wear throughout the  day and a shield with which to sleep tonight. The patient was also given drops with which to begin a drop regimen today and will follow-up with me in one day. Implant Name Type Inv. Item Serial No. Manufacturer Lot No. LRB No. Used Action  LENS IOL TECNIS EYHANCE 20.5 - [id number] Intraocular Lens LENS IOL TECNIS EYHANCE 20.5 [id number] [employer]  Right 1 Implanted   Procedure(s): PHACOEMULSIFICATION, CATARACT, WITH IOL INSERTION 5.39 00:35.5 (Right)  Electronically signed: Elsie Carmine 09/28/2024 10:12 AM

## 2024-10-15 ENCOUNTER — Ambulatory Visit (INDEPENDENT_AMBULATORY_CARE_PROVIDER_SITE_OTHER): Admitting: Family Medicine

## 2024-10-15 ENCOUNTER — Encounter: Payer: Self-pay | Admitting: Family Medicine

## 2024-10-15 VITALS — BP 139/80 | HR 85 | Temp 97.9°F | Ht 60.0 in | Wt 122.0 lb

## 2024-10-15 DIAGNOSIS — J0100 Acute maxillary sinusitis, unspecified: Secondary | ICD-10-CM | POA: Diagnosis not present

## 2024-10-15 MED ORDER — AZITHROMYCIN 250 MG PO TABS: ORAL_TABLET | ORAL | 0 refills | Status: AC

## 2024-10-15 NOTE — Progress Notes (Signed)
 "     Established patient visit   Patient: Jeanne Lewis   DOB: [birth year]   73 y.o. Female  MRN: [id number] Visit Date: 10/15/2024  Today's healthcare provider: Nancyann Perry, MD   Chief Complaint  Patient presents with   URI    Cough congestion , sore throat , no fever , no SOB. All stared on 10/08/24 OTC meds: none     Subjective    Discussed the use of AI scribe software for clinical note transcription with the patient, who gave verbal consent to proceed.  History of Present Illness   Jeanne Lewis is a 73 year old female who presents with fever and cold symptoms.  She has been experiencing intermittent fever for the past two to three weeks, with the most recent fever reaching 101F yesterday. The fever fluctuates, sometimes feeling worse when elevated.  Her symptoms began with a cough, runny nose, and sore throat. The sore throat resolved but recurred today after being absent for about a week. She also has a productive cough with phlegm described as 'snotty nose' and experiences pressure around her eyes and sinuses at times.  She does not report significant shortness of breath. Her ears feel itchy and dirty, but she avoids cleaning them due to worsening vertigo.  She recalls having cataract surgery the Sunday before her symptoms began. She is allergic to an antibiotic with 'three things in it,' possibly Augmentin, which caused itching and throat closure. She has previously tolerated Zithromax  (Z-Pak) well.      Medications: Show/hide medication list[1] Review of Systems     Objective    BP 139/80 (BP Location: Left Arm, Patient Position: Sitting, Cuff Size: Normal)   Pulse 85   Temp 97.9 F (36.6 C)   Ht 5' (1.524 m)   Wt 122 lb (55.3 kg)   SpO2 98%   BMI 23.83 kg/m   Physical Exam   General Appearance:    Well developed, well nourished female, alert, cooperative, in no acute distress  HENT:   bilateral TM normal without fluid or infection, neck without nodes,  maxillary sinus tenderness, and nasal mucosa congested  Eyes:    PERRL, conjunctiva/corneas clear, EOM's intact       Lungs:     Clear to auscultation bilaterally, respirations unlabored  Heart:    Normal heart rate. Normal rhythm. No murmurs, rubs, or gallops.    Neurologic:   Awake, alert, oriented x 3. No apparent focal neurological           defect.        Assessment & Plan       Acute sinusitis Intermittent fever, cough, nasal congestion, and sore throat for two weeks. Sinus tenderness indicates infection. Allergic to Augmentin, previously tolerated Zithromax . - Prescribed Zithromax  (azithromycin ).  Call if symptoms change or if not rapidly improving.           Nancyann Perry, MD  [facility] [phone] (phone) [phone] (fax)  [facility]    [1]  Outpatient Medications Prior to Visit  Medication Sig   Acetaminophen  500 MG capsule Take 500 mg by mouth every 4 (four) hours as needed. Patient takes 2 (500mg  Caplets) in the morning and 2  (500 mg caplets) in the PM.   ascorbic acid (VITAMIN C) 250 MG CHEW Chew 1,000 mg by mouth daily.   ibuprofen  (ADVIL ) 200 MG tablet Take 200 mg by mouth every 6 (six) hours as needed.   Liniments (  DEEP BLUE RELIEF EX) Apply 1 Application topically daily.   loratadine (CLARITIN) 10 MG tablet Take 10 mg by mouth daily.   Multiple Vitamins-Minerals (MULTIVITAMIN WITH MINERALS) tablet Take 1 tablet by mouth daily.   NON FORMULARY Take 1 capsule by mouth daily. Tumeric   NON FORMULARY TERRAZINE   NON FORMULARY TRIEASE   NON FORMULARY ON GUARD   NON FORMULARY PB RESTORE   PEGASYS 180 MCG/ML injection INJECT 90MCG (0.5ML)  SUBCUTANEOUSLY WEEKLY (DISCARD  UNUSED MEDICATION EACH VIAL)   PROBIOTIC, LACTOBACILLUS, PO Take 1 tablet by mouth daily.   No facility-administered medications prior to visit.   "

## 2024-10-25 ENCOUNTER — Ambulatory Visit: Payer: Self-pay

## 2024-10-25 NOTE — Telephone Encounter (Signed)
 Agree with need for her to be seen and evaluated. Can see if any of our neighborhood clinics have acute access today or tomorrow if we are full.

## 2024-10-25 NOTE — Telephone Encounter (Signed)
 FYI Only or Action Required?: FYI only for provider: UC advised.  Patient was last seen in primary care on 10/15/2024 by Gasper Nancyann BRAVO, MD.  Called Nurse Triage reporting Facial Pain.  Symptoms began several days ago.  Interventions attempted: Prescription medications: Z-pak for sinus, nothing for UTI symptoms.  Symptoms are: unchanged.  Triage Disposition: See Physician Within 24 Hours  Patient/caregiver understands and will follow disposition?: Yes Reason for Disposition  Age > 50 years  Answer Assessment - Initial Assessment Questions Patient taking Tylenol . Was advised to call if symptoms change or worsen, patient would like to know if another z-pak  can be called in or what can be. Reports burning with urination off and on, urgency but not much comes out. Advised UC or urine symptoms due to no availability in PCP office until Wednesday.   1. LOCATION: Where does it hurt?      Forehead, cheeks  2. ONSET: When did the sinus pain start?  (e.g., hours, days)      3 days ago  3. SEVERITY: How bad is the pain?   (Scale 0-10; or none, mild, moderate or severe)     Moderate  4. RECURRENT SYMPTOM: Have you ever had sinus problems before? If Yes, ask: When was the last time? and What happened that time?      Yes, seen 10/15/24 given a z-pak  5. NASAL CONGESTION: Is the nose blocked? If Yes, ask: Can you open it or must you breathe through your mouth?     Yes, mouth breathing  6. NASAL DISCHARGE: Do you have discharge from your nose? If so ask, What color?     Yes, blowing nose, off and on clear, sort of yellow  7. FEVER: Do you have a fever? If Yes, ask: What is it, how was it measured, and when did it start?      99.3  8. OTHER SYMPTOMS: Do you have any other symptoms? (e.g., sore throat, cough, earache, difficulty breathing)     Sore throat off and on.  Protocols used: Sinus Pain or Congestion-A-AH, Urination Pain - [facility]  Copied from CRM  [id number]. Topic: Clinical - Medical Advice >> Oct 25, 2024  9:01 AM Delon HERO wrote: Reason for CRM: Patient evaluated 10/15/24 for Acute non-recurrent maxillary sinusitis prescribed z- pack symptoms have returned with cough, temperature, congestion.  [facility] [address] ([address]), [place] - [address] ([address]) [place] [address] Phone: [phone] Fax: [phone] Hours: Not open 24 hours

## 2024-10-25 NOTE — Telephone Encounter (Signed)
 Pt prefer to be seen today. No appointment available and currently headed to eye exam. Reports she will go to UC at the end of her visit

## 2025-05-11 ENCOUNTER — Ambulatory Visit

## 2025-05-13 ENCOUNTER — Ambulatory Visit

## 2025-05-31 ENCOUNTER — Ambulatory Visit: Admitting: Dermatology
